# Patient Record
Sex: Female | Born: 1945 | Race: White | Hispanic: No | Marital: Married | Smoking: Never smoker
Health system: Southern US, Community
[De-identification: ages and names within clinical notes are randomized; demographics above are authoritative.]

## PROBLEM LIST (undated history)

## (undated) DIAGNOSIS — E785 Hyperlipidemia, unspecified: Secondary | ICD-10-CM

## (undated) DIAGNOSIS — C50919 Malignant neoplasm of unspecified site of unspecified female breast: Secondary | ICD-10-CM

## (undated) DIAGNOSIS — H44421 Hypotony of right eye due to ocular fistula: Secondary | ICD-10-CM

## (undated) DIAGNOSIS — R52 Pain, unspecified: Secondary | ICD-10-CM

## (undated) DIAGNOSIS — M2550 Pain in unspecified joint: Secondary | ICD-10-CM

## (undated) DIAGNOSIS — T451X5A Adverse effect of antineoplastic and immunosuppressive drugs, initial encounter: Secondary | ICD-10-CM

## (undated) HISTORY — PX: TUBAL LIGATION: SHX77

## (undated) HISTORY — DX: Adverse effect of antineoplastic and immunosuppressive drugs, initial encounter: T45.1X5A

## (undated) HISTORY — DX: Hyperlipidemia, unspecified: E78.5

## (undated) HISTORY — PX: COLONOSCOPY: SHX174

## (undated) HISTORY — PX: REDUCTION MAMMAPLASTY: SUR839

## (undated) HISTORY — PX: BREAST SURGERY: SHX581

## (undated) HISTORY — DX: Malignant neoplasm of unspecified site of unspecified female breast: C50.919

## (undated) HISTORY — DX: Pain, unspecified: R52

## (undated) HISTORY — DX: Pain in unspecified joint: M25.50

## (undated) HISTORY — PX: GANGLION CYST EXCISION: SHX1691

---

## 1997-12-14 ENCOUNTER — Other Ambulatory Visit: Admission: RE | Admit: 1997-12-14 | Discharge: 1997-12-14 | Payer: Self-pay | Admitting: *Deleted

## 1998-12-06 ENCOUNTER — Other Ambulatory Visit: Admission: RE | Admit: 1998-12-06 | Discharge: 1998-12-06 | Payer: Self-pay | Admitting: *Deleted

## 1999-05-03 ENCOUNTER — Encounter: Payer: Self-pay | Admitting: Orthopedic Surgery

## 1999-05-03 ENCOUNTER — Ambulatory Visit (HOSPITAL_COMMUNITY): Admission: RE | Admit: 1999-05-03 | Discharge: 1999-05-03 | Payer: Self-pay | Admitting: Orthopedic Surgery

## 2000-03-26 ENCOUNTER — Other Ambulatory Visit: Admission: RE | Admit: 2000-03-26 | Discharge: 2000-03-26 | Payer: Self-pay | Admitting: *Deleted

## 2001-06-29 ENCOUNTER — Other Ambulatory Visit: Admission: RE | Admit: 2001-06-29 | Discharge: 2001-06-29 | Payer: Self-pay | Admitting: Obstetrics and Gynecology

## 2002-06-30 ENCOUNTER — Other Ambulatory Visit: Admission: RE | Admit: 2002-06-30 | Discharge: 2002-06-30 | Payer: Self-pay | Admitting: *Deleted

## 2003-09-01 ENCOUNTER — Other Ambulatory Visit: Admission: RE | Admit: 2003-09-01 | Discharge: 2003-09-01 | Payer: Self-pay | Admitting: *Deleted

## 2004-03-16 ENCOUNTER — Ambulatory Visit (HOSPITAL_COMMUNITY): Admission: RE | Admit: 2004-03-16 | Discharge: 2004-03-16 | Payer: Self-pay | Admitting: Gastroenterology

## 2005-02-21 ENCOUNTER — Other Ambulatory Visit: Admission: RE | Admit: 2005-02-21 | Discharge: 2005-02-21 | Payer: Self-pay | Admitting: *Deleted

## 2010-06-17 HISTORY — PX: MASTECTOMY: SHX3

## 2010-09-06 ENCOUNTER — Other Ambulatory Visit: Payer: Self-pay | Admitting: Radiology

## 2010-09-07 ENCOUNTER — Other Ambulatory Visit: Payer: Self-pay | Admitting: Radiology

## 2010-09-07 DIAGNOSIS — C50912 Malignant neoplasm of unspecified site of left female breast: Secondary | ICD-10-CM

## 2010-09-11 ENCOUNTER — Ambulatory Visit
Admission: RE | Admit: 2010-09-11 | Discharge: 2010-09-11 | Disposition: A | Discharge status: Home / Self Care | Payer: Medicare Other | Source: Ambulatory Visit | Attending: Radiology | Admitting: Radiology

## 2010-09-11 DIAGNOSIS — C50912 Malignant neoplasm of unspecified site of left female breast: Secondary | ICD-10-CM

## 2010-09-11 MED ORDER — GADOBENATE DIMEGLUMINE 529 MG/ML IV SOLN
15.0000 mL | Freq: Once | INTRAVENOUS | Status: AC | PRN
  Administered 2010-09-11: 15 mL via INTRAVENOUS

## 2010-09-12 ENCOUNTER — Encounter (HOSPITAL_BASED_OUTPATIENT_CLINIC_OR_DEPARTMENT_OTHER): Payer: Medicare Other | Admitting: Oncology

## 2010-09-12 ENCOUNTER — Other Ambulatory Visit: Payer: Self-pay | Admitting: Oncology

## 2010-09-12 DIAGNOSIS — C50919 Malignant neoplasm of unspecified site of unspecified female breast: Secondary | ICD-10-CM

## 2010-09-12 LAB — COMPREHENSIVE METABOLIC PANEL
ALT: 20 U/L (ref 0–35)
AST: 23 U/L (ref 0–37)
Albumin: 4.2 g/dL (ref 3.5–5.2)
Alkaline Phosphatase: 46 U/L (ref 39–117)
BUN: 13 mg/dL (ref 6–23)
CO2: 27 mEq/L (ref 19–32)
Calcium: 10.6 mg/dL — ABNORMAL HIGH (ref 8.4–10.5)
Chloride: 107 mEq/L (ref 96–112)
Creatinine, Ser: 0.63 mg/dL (ref 0.40–1.20)
Glucose, Bld: 80 mg/dL (ref 70–99)
Potassium: 4.4 mEq/L (ref 3.5–5.3)
Sodium: 140 mEq/L (ref 135–145)
Total Bilirubin: 1 mg/dL (ref 0.3–1.2)
Total Protein: 7.3 g/dL (ref 6.0–8.3)

## 2010-09-12 LAB — CBC WITH DIFFERENTIAL/PLATELET
BASO%: 0.4 % (ref 0.0–2.0)
Basophils Absolute: 0 10*3/uL (ref 0.0–0.1)
EOS%: 2.4 % (ref 0.0–7.0)
Eosinophils Absolute: 0.2 10*3/uL (ref 0.0–0.5)
HCT: 41.7 % (ref 34.8–46.6)
HGB: 14.1 g/dL (ref 11.6–15.9)
LYMPH%: 26.1 % (ref 14.0–49.7)
MCH: 30.5 pg (ref 25.1–34.0)
MCHC: 33.8 g/dL (ref 31.5–36.0)
MCV: 90.3 fL (ref 79.5–101.0)
MONO#: 0.8 10*3/uL (ref 0.1–0.9)
MONO%: 11.6 % (ref 0.0–14.0)
NEUT#: 4.3 10*3/uL (ref 1.5–6.5)
NEUT%: 59.5 % (ref 38.4–76.8)
Platelets: 227 10*3/uL (ref 145–400)
RBC: 4.62 10*6/uL (ref 3.70–5.45)
RDW: 13.8 % (ref 11.2–14.5)
WBC: 7.2 10*3/uL (ref 3.9–10.3)
lymph#: 1.9 10*3/uL (ref 0.9–3.3)

## 2010-09-12 LAB — CANCER ANTIGEN 27.29: CA 27.29: 14 U/mL (ref 0–39)

## 2010-09-14 ENCOUNTER — Other Ambulatory Visit: Payer: Self-pay | Admitting: Radiology

## 2010-09-14 DIAGNOSIS — R928 Other abnormal and inconclusive findings on diagnostic imaging of breast: Secondary | ICD-10-CM

## 2010-09-20 ENCOUNTER — Other Ambulatory Visit: Payer: Self-pay | Admitting: Radiology

## 2010-09-20 ENCOUNTER — Other Ambulatory Visit: Payer: Self-pay | Admitting: Diagnostic Radiology

## 2010-09-20 ENCOUNTER — Ambulatory Visit
Admission: RE | Admit: 2010-09-20 | Discharge: 2010-09-20 | Disposition: A | Discharge status: Home / Self Care | Payer: Medicare Other | Source: Ambulatory Visit | Attending: Radiology | Admitting: Radiology

## 2010-09-20 DIAGNOSIS — R928 Other abnormal and inconclusive findings on diagnostic imaging of breast: Secondary | ICD-10-CM

## 2010-09-20 MED ORDER — GADOBENATE DIMEGLUMINE 529 MG/ML IV SOLN
15.0000 mL | Freq: Once | INTRAVENOUS | Status: AC | PRN
  Administered 2010-09-20: 15 mL via INTRAVENOUS

## 2010-10-01 ENCOUNTER — Encounter (HOSPITAL_BASED_OUTPATIENT_CLINIC_OR_DEPARTMENT_OTHER): Payer: Medicare Other | Admitting: Oncology

## 2010-10-01 DIAGNOSIS — C50919 Malignant neoplasm of unspecified site of unspecified female breast: Secondary | ICD-10-CM

## 2010-10-31 ENCOUNTER — Other Ambulatory Visit (HOSPITAL_COMMUNITY): Payer: Self-pay | Admitting: Surgery

## 2010-10-31 ENCOUNTER — Other Ambulatory Visit: Payer: Self-pay | Admitting: Oncology

## 2010-10-31 ENCOUNTER — Encounter (HOSPITAL_BASED_OUTPATIENT_CLINIC_OR_DEPARTMENT_OTHER): Payer: Medicare Other | Admitting: Oncology

## 2010-10-31 DIAGNOSIS — C50919 Malignant neoplasm of unspecified site of unspecified female breast: Secondary | ICD-10-CM

## 2010-10-31 DIAGNOSIS — C50912 Malignant neoplasm of unspecified site of left female breast: Secondary | ICD-10-CM

## 2010-10-31 LAB — CBC WITH DIFFERENTIAL/PLATELET
BASO%: 0.7 % (ref 0.0–2.0)
EOS%: 1.7 % (ref 0.0–7.0)
LYMPH%: 37.6 % (ref 14.0–49.7)
MCH: 30.8 pg (ref 25.1–34.0)
MCHC: 33.5 g/dL (ref 31.5–36.0)
MONO#: 0.6 10*3/uL (ref 0.1–0.9)
Platelets: 224 10*3/uL (ref 145–400)
RBC: 4.43 10*6/uL (ref 3.70–5.45)
WBC: 5.4 10*3/uL (ref 3.9–10.3)

## 2010-10-31 LAB — COMPREHENSIVE METABOLIC PANEL
ALT: 19 U/L (ref 0–35)
AST: 21 U/L (ref 0–37)
Alkaline Phosphatase: 53 U/L (ref 39–117)
CO2: 24 mEq/L (ref 19–32)
Sodium: 143 mEq/L (ref 135–145)
Total Bilirubin: 1.3 mg/dL — ABNORMAL HIGH (ref 0.3–1.2)
Total Protein: 6.8 g/dL (ref 6.0–8.3)

## 2010-12-07 ENCOUNTER — Encounter (INDEPENDENT_AMBULATORY_CARE_PROVIDER_SITE_OTHER): Payer: Self-pay | Admitting: Surgery

## 2010-12-20 ENCOUNTER — Encounter (INDEPENDENT_AMBULATORY_CARE_PROVIDER_SITE_OTHER): Payer: Self-pay | Admitting: Surgery

## 2010-12-20 ENCOUNTER — Ambulatory Visit (INDEPENDENT_AMBULATORY_CARE_PROVIDER_SITE_OTHER): Payer: Medicare Other | Admitting: Surgery

## 2010-12-20 VITALS — Temp 96.7°F | Ht 66.0 in | Wt 192.2 lb

## 2010-12-20 DIAGNOSIS — C50919 Malignant neoplasm of unspecified site of unspecified female breast: Secondary | ICD-10-CM

## 2010-12-20 NOTE — Progress Notes (Signed)
CC: Breast cancer HPI: This patient was seen a few weeks ago and a multidisciplinary clinic. She has a multifocal left breast cancer. She is scheduled for a mastectomy, sentinel lymph node evaluation, and reconstruction next week. She came in for preoperative review.  ROS: There been no changes since the original ROS that is noted in the chart.  MEDS:  ALLERGIES:   PE General: The patient is alert, oriented, and healthy-appearing Lungs: Normal respirations clear to auscultation. Heart: Regular rhythm no murmurs rubs or gallops. Breasts: Symmetric, with no palpable mass or tenderness. Lymphatics: No axillary or supraclavicular adenopathy noted. Abdomen: Soft, benign  Data Reviewed I have reviewed all of our old notes pathology reports et Karie Soda. Of note is that she was started on Femara while we were waiting to get her surgery done.  Assessment Multifocal stage I left breast cancer  Plan Left mastectomy with sentinel node evaluation. Plan reconstruction.  I discussed with the patient including risks and complications need for drains potential for positive lymph nodes et Karie Soda she does have most of her postoperative care will be done by her plastic surgeon. I think all questions have been answered. We will proceed with surgery as scheduled on July 9

## 2010-12-20 NOTE — Patient Instructions (Signed)
We have you scheduled for surgery. Come to the hospital as previously arranged

## 2010-12-21 ENCOUNTER — Encounter (HOSPITAL_COMMUNITY)
Admission: RE | Admit: 2010-12-21 | Discharge: 2010-12-21 | Disposition: A | Discharge status: Home / Self Care | Payer: Medicare Other | Source: Ambulatory Visit | Attending: Surgery | Admitting: Surgery

## 2010-12-21 ENCOUNTER — Other Ambulatory Visit (INDEPENDENT_AMBULATORY_CARE_PROVIDER_SITE_OTHER): Payer: Self-pay | Admitting: Surgery

## 2010-12-21 ENCOUNTER — Ambulatory Visit (HOSPITAL_COMMUNITY)
Admission: RE | Admit: 2010-12-21 | Discharge: 2010-12-21 | Disposition: A | Discharge status: Home / Self Care | Payer: Medicare Other | Source: Ambulatory Visit | Attending: Surgery | Admitting: Surgery

## 2010-12-21 DIAGNOSIS — Z01810 Encounter for preprocedural cardiovascular examination: Secondary | ICD-10-CM | POA: Insufficient documentation

## 2010-12-21 DIAGNOSIS — Z01818 Encounter for other preprocedural examination: Secondary | ICD-10-CM | POA: Insufficient documentation

## 2010-12-21 DIAGNOSIS — Z01812 Encounter for preprocedural laboratory examination: Secondary | ICD-10-CM | POA: Insufficient documentation

## 2010-12-21 DIAGNOSIS — C50912 Malignant neoplasm of unspecified site of left female breast: Secondary | ICD-10-CM

## 2010-12-21 DIAGNOSIS — C50919 Malignant neoplasm of unspecified site of unspecified female breast: Secondary | ICD-10-CM | POA: Insufficient documentation

## 2010-12-21 LAB — SURGICAL PCR SCREEN
MRSA, PCR: POSITIVE — AB
Staphylococcus aureus: POSITIVE — AB

## 2010-12-21 LAB — CBC
MCH: 31.1 pg (ref 26.0–34.0)
MCHC: 34.4 g/dL (ref 30.0–36.0)
MCV: 90.3 fL (ref 78.0–100.0)
Platelets: 205 10*3/uL (ref 150–400)

## 2010-12-21 LAB — BASIC METABOLIC PANEL
Calcium: 11.4 mg/dL — ABNORMAL HIGH (ref 8.4–10.5)
Creatinine, Ser: 0.52 mg/dL (ref 0.50–1.10)
GFR calc non Af Amer: >60 mL/min (ref >60)
Sodium: 142 mEq/L (ref 135–145)

## 2010-12-21 LAB — DIFFERENTIAL
Basophils Relative: 1 % (ref 0–1)
Eosinophils Absolute: 0.1 10*3/uL (ref 0.0–0.7)
Eosinophils Relative: 2 % (ref 0–5)
Lymphs Abs: 2.1 10*3/uL (ref 0.7–4.0)
Monocytes Absolute: 0.8 10*3/uL (ref 0.1–1.0)
Monocytes Relative: 14 % — ABNORMAL HIGH (ref 3–12)

## 2010-12-24 ENCOUNTER — Other Ambulatory Visit (INDEPENDENT_AMBULATORY_CARE_PROVIDER_SITE_OTHER): Payer: Self-pay | Admitting: Surgery

## 2010-12-24 ENCOUNTER — Inpatient Hospital Stay (HOSPITAL_COMMUNITY)
Admission: RE | Admit: 2010-12-24 | Discharge: 2010-12-24 | Disposition: A | Discharge status: Home / Self Care | Payer: Medicare Other | Source: Ambulatory Visit | Attending: Surgery | Admitting: Surgery

## 2010-12-24 ENCOUNTER — Observation Stay (HOSPITAL_COMMUNITY)
Admission: RE | Admit: 2010-12-24 | Discharge: 2010-12-25 | Disposition: A | Discharge status: Home / Self Care | Payer: Medicare Other | Source: Ambulatory Visit | Attending: Surgery | Admitting: Surgery

## 2010-12-24 DIAGNOSIS — Z01812 Encounter for preprocedural laboratory examination: Secondary | ICD-10-CM | POA: Insufficient documentation

## 2010-12-24 DIAGNOSIS — D0590 Unspecified type of carcinoma in situ of unspecified breast: Secondary | ICD-10-CM

## 2010-12-24 DIAGNOSIS — C773 Secondary and unspecified malignant neoplasm of axilla and upper limb lymph nodes: Secondary | ICD-10-CM | POA: Insufficient documentation

## 2010-12-24 DIAGNOSIS — Z13820 Encounter for screening for osteoporosis: Secondary | ICD-10-CM | POA: Insufficient documentation

## 2010-12-24 DIAGNOSIS — C50919 Malignant neoplasm of unspecified site of unspecified female breast: Principal | ICD-10-CM | POA: Insufficient documentation

## 2010-12-24 DIAGNOSIS — C50912 Malignant neoplasm of unspecified site of left female breast: Secondary | ICD-10-CM

## 2010-12-24 DIAGNOSIS — Z01818 Encounter for other preprocedural examination: Secondary | ICD-10-CM | POA: Insufficient documentation

## 2010-12-24 DIAGNOSIS — Z79899 Other long term (current) drug therapy: Secondary | ICD-10-CM | POA: Insufficient documentation

## 2010-12-24 DIAGNOSIS — E119 Type 2 diabetes mellitus without complications: Secondary | ICD-10-CM | POA: Insufficient documentation

## 2010-12-24 LAB — GLUCOSE, CAPILLARY: Glucose-Capillary: 151 mg/dL — ABNORMAL HIGH (ref 70–99)

## 2010-12-24 MED ORDER — TECHNETIUM TC 99M SULFUR COLLOID FILTERED
1.0000 | Freq: Once | INTRAVENOUS | Status: AC | PRN
  Administered 2010-12-24: 1 via INTRADERMAL

## 2010-12-25 LAB — GLUCOSE, CAPILLARY
Glucose-Capillary: 119 mg/dL — ABNORMAL HIGH (ref 70–99)
Glucose-Capillary: 104 mg/dL — ABNORMAL HIGH (ref 70–99)

## 2010-12-25 NOTE — Op Note (Signed)
Jennifer Rivera, IRVING NO.:  [id number]  MEDICAL RECORD NO.:  [id number]  LOCATION:  5123                         FACILITY:  [facility]  PHYSICIAN:  Currie Paris, M.D.DATE OF BIRTH:  01-Nov-1945  DATE OF PROCEDURE:  12/24/2010 DATE OF DISCHARGE:                              OPERATIVE REPORT   PREOPERATIVE DIAGNOSIS:  Carcinoma, left breast, multifocal, invasive.  POSTOPERATIVE DIAGNOSIS:  Carcinoma, left breast, multifocal, invasive with axillary metastases (stage II).  PROCEDURE:  Left modified radical mastectomy with blue dye injection and sentinel lymph node identification.  SURGEON:  Currie Paris, MD  ASSISTANT:  Abigail Miyamoto, MD  ANESTHESIA:  General.  CLINICAL HISTORY:  This is a 65 year old lady recently found to have a left breast mass and a second suspicious area on mammogram.  It was receptor positive.  After we found the second area, it was elected to do a mastectomy with sentinel node evaluation.  Clinically, her nodes were negative.  Because of some travel arrangements, the patient wished to defer her surgery for approximately a month, so she was started on neoadjuvant Femara.  DESCRIPTION OF PROCEDURE:  I saw the patient in the holding area and she had no further questions.  We reviewed the plans for the surgery with the plans for a implant (tissue expander) for reconstruction by Dr. Odis Luster.  We reviewed the plans for sentinel node evaluation and the plans for an attempted skin sparing mastectomy.  The patient was taken to the operating room and after satisfactory general anesthesia had been obtained, the time-out was done.  I injected dilute methylene blue separately on the left.  I outlined an oblique incision elliptically just wide enough to encompass the nipple-areolar complex.  I used the Neoprobe and identified a hot area and marked the overlying skin in the axilla.  I raised the superior skin flap to the sternum up  to the clavicle and out into the axilla.  Using the Neoprobe, I directed my attention to open the clavipectoral fascia and identified almost immediately a blue lymphatic leading to a blue lymph node.  There was a second non-hot, non- blue lymph node adjacent and I took both of the hot one and the other one out as this seemed to be intimately attached.  This was sent as sentinel lymph node #1.  Another hot area was identified and a very small, normal-appearing somewhat higher and more medially located node was removed and sent.  I asked for touch preps on these.  I then went back and did the inferior flap in the usual fashion and took it out laterally to the latissimus.  The breast was removed from medial to lateral taking the fascia.  As I got out to the axilla, I divided the breast tissue off the chest wall without entering the axilla.  I stopped to make sure everything was dry.  At this point, Dr. Colonel Bald reported that one blue sentinel lymph node was positive for metastatic disease.  Therefore, as planned, I went ahead with additional node dissection.  I fully opened the clavipectoral fascia, identified the axillary vein and started stripping the contents out from medial to lateral, superior to inferior  preserving the long thoracic and thoracodorsal nerves, and the nerve to the pectoralis.  This was all done with a combination of blunt and sharp dissection.  I used clips on vessels.  Once this was out, I reirrigated and made sure everything was dry.  I pinched the long thoracic and thoracodorsal nerves and they both appeared to be working okay.  Warm packs were placed and we waited Dr. Odis Luster to review for implant reconstruction.     Currie Paris, M.D.     CJS/MEDQ  D:  12/24/2010  T:  12/24/2010  Job:  [id number]  cc:   [name], M.D.  Electronically Signed by Cyndia Bent M.D. on 12/25/2010 07:55:18 AM

## 2011-01-08 ENCOUNTER — Other Ambulatory Visit: Payer: Self-pay | Admitting: Oncology

## 2011-01-08 ENCOUNTER — Encounter (HOSPITAL_BASED_OUTPATIENT_CLINIC_OR_DEPARTMENT_OTHER): Payer: Medicare Other | Admitting: Oncology

## 2011-01-08 DIAGNOSIS — C50919 Malignant neoplasm of unspecified site of unspecified female breast: Secondary | ICD-10-CM

## 2011-01-08 DIAGNOSIS — C50219 Malignant neoplasm of upper-inner quadrant of unspecified female breast: Secondary | ICD-10-CM

## 2011-01-08 DIAGNOSIS — C50519 Malignant neoplasm of lower-outer quadrant of unspecified female breast: Secondary | ICD-10-CM

## 2011-01-08 LAB — COMPREHENSIVE METABOLIC PANEL
ALT: 18 U/L (ref 0–35)
Alkaline Phosphatase: 47 U/L (ref 39–117)
Potassium: 5.1 mEq/L (ref 3.5–5.3)
Sodium: 142 mEq/L (ref 135–145)
Total Bilirubin: 0.7 mg/dL (ref 0.3–1.2)
Total Protein: 6.3 g/dL (ref 6.0–8.3)

## 2011-01-08 LAB — CBC WITH DIFFERENTIAL/PLATELET
Basophils Absolute: 0 10*3/uL (ref 0.0–0.1)
Eosinophils Absolute: 0.2 10*3/uL (ref 0.0–0.5)
HCT: 40.4 % (ref 34.8–46.6)
HGB: 13.1 g/dL (ref 11.6–15.9)
LYMPH%: 29.2 % (ref 14.0–49.7)
MCV: 90.6 fL (ref 79.5–101.0)
MONO%: 8.9 % (ref 0.0–14.0)
RBC: 4.46 10*6/uL (ref 3.70–5.45)
WBC: 6.3 10*3/uL (ref 3.9–10.3)

## 2011-01-18 ENCOUNTER — Ambulatory Visit (INDEPENDENT_AMBULATORY_CARE_PROVIDER_SITE_OTHER): Payer: Medicare Other | Admitting: Surgery

## 2011-01-18 ENCOUNTER — Encounter (INDEPENDENT_AMBULATORY_CARE_PROVIDER_SITE_OTHER): Payer: Medicare Other | Admitting: Surgery

## 2011-01-18 ENCOUNTER — Encounter (INDEPENDENT_AMBULATORY_CARE_PROVIDER_SITE_OTHER): Payer: Self-pay | Admitting: Surgery

## 2011-01-18 VITALS — BP 132/98 | HR 76 | Temp 95.6°F

## 2011-01-18 DIAGNOSIS — C50919 Malignant neoplasm of unspecified site of unspecified female breast: Secondary | ICD-10-CM

## 2011-01-18 MED ORDER — CLINDAMYCIN HCL 300 MG PO CAPS: 300.0000 mg | ORAL_CAPSULE | ORAL | Status: AC

## 2011-01-18 MED ORDER — HYDROMORPHONE HCL 2 MG PO TABS: 2.0000 mg | ORAL_TABLET | ORAL | Status: DC | PRN

## 2011-01-18 NOTE — Patient Instructions (Addendum)
Call to see about an ABC class. Check with Dr. Odis Luster Monday to get the drain out. Continue taking antibiotic until Dr Odis Luster says its ok to stop

## 2011-01-18 NOTE — Progress Notes (Signed)
CC: Postop visit with pain from drain  HPI: This patient comes in for post op follow-up. She underwent left mastectomy and lymph node dissection on December 24, 2010. She feels that she is doing well except for significant pain around the drain site which is draining more than about 40 cc per day.  PE: General: The patient appears to be healthy, NAD  The left breast status post reconstruction and looks healthy. She continues to have drainage. This is about 40 cc. The drain itself was under a lot of tension and I think her pain was from the suture pulling on the skin. I redressed out with less tension and the pain resolved.  IMPRESSION: The patient is doing well S/P mastectomy and reconstruction. The only issue is to continue drainage. She is following up with Dr. Odis Luster next week.  DATA REVIWED: Dr. was noted that she has already discussed this with her oncologist  PLAN: Gave her some exercises to do when she gets the okay from Dr. Odis Luster. Gave her an ABC appointment okay. I will see her back in two weeks.

## 2011-01-22 ENCOUNTER — Encounter (INDEPENDENT_AMBULATORY_CARE_PROVIDER_SITE_OTHER): Payer: Medicare Other | Admitting: Surgery

## 2011-01-30 NOTE — Op Note (Signed)
  NAMEMarland Kitchen  NIAYA, HICKOK NO.:  [id number]  MEDICAL RECORD NO.:  [id number]  LOCATION:  5123                         FACILITY:  [facility]  PHYSICIAN:  Etter Sjogren, M.D.     DATE OF BIRTH:  September 06, 1945  DATE OF PROCEDURE:  12/24/2010 DATE OF DISCHARGE:                              OPERATIVE REPORT   PREOPERATIVE DIAGNOSIS:  Left breast cancer.  POSTOPERATIVE DIAGNOSIS:  Left breast cancer.  PROCEDURE PERFORMED:  Left breast reconstruction with tissue expander.  SURGEON:  Etter Sjogren, MD.  ANESTHESIA:  General.  ESTIMATED BLOOD LOSS:  5 mL.  DRAINS:  Two 19-French drains.  CLINICAL NOTE:  This is a 65 year old woman who has left breast cancer is having mastectomy and desires reconstruction.  Options discussed. She selected tissue expander as a planned stage procedure with eventual placement of implant.  She understood that asymmetry type of procedure might be needed on the opposite breast as well.  Risks plus complications discussed and she understood those and wished to proceed.  DESCRIPTION:  The patient was taken to the operating room.  Dr. Jamey Ripa had completed the mastectomy.  The wound was irrigated thoroughly with saline.  Good hemostasis was confirmed.  The submuscular space was developed beneath the pectoralis major muscle and the serratus anterior muscle taking great care to avoid damage to the underlying chest cavity and to achieve excellent hemostasis using electrocautery.  Thorough irrigation with antibiotic solution.  Thorough cleansing of gloves.  The expander was prepared.  This was a Mentor 650 mL tissue expander, 100 mL sterile saline placed using a closed filling system and expanders soaked in antibiotic solution for greater than 5 minutes.  Expander was positioned after again checking and confirming excellent hemostasis and the muscle was closed with 3-0 Vicryl interrupted figure-of-eight sutures.  Great care taken to avoid damage to  underlying expander which was kept under direct vision at all times.  Antibiotic solution also placed on the expander again just prior to placing the last sutures. Space was then checked from the mastectomy.  Meticulous hemostasis with electrocautery.  Two 19-French drains were positioned, brought through separate stab wounds inferolaterally and secured with 3-0 Prolene sutures.  Skin flaps had excellent color, bright red bleeding on the periphery consistent with viability and the skin flap closure with 3-0 Monocryl interrupted deep dermal sutures.  Antibiotic solution again placed just prior to tying the end of the last suture and then Dermabond used to complete the closure, dry sterile dressings and the Biopatch placed around the drains with Tegaderm and dry sterile dressings placed as well as the chest vest and she was transferred to the recovery room in stable having tolerated the procedure well.     Etter Sjogren, M.D.     DB/MEDQ  D:  12/24/2010  T:  12/24/2010  Job:  [id number]  Electronically Signed by Etter Sjogren M.D. on 01/30/2011 02:56:53 PM

## 2011-04-08 ENCOUNTER — Encounter (HOSPITAL_BASED_OUTPATIENT_CLINIC_OR_DEPARTMENT_OTHER)
Admission: RE | Admit: 2011-04-08 | Discharge: 2011-04-08 | Disposition: A | Discharge status: Home / Self Care | Payer: Medicare Other | Source: Ambulatory Visit | Attending: Plastic Surgery | Admitting: Plastic Surgery

## 2011-04-08 LAB — BASIC METABOLIC PANEL
BUN: 12 mg/dL (ref 6–23)
Calcium: 10.8 mg/dL — ABNORMAL HIGH (ref 8.4–10.5)
Creatinine, Ser: 0.56 mg/dL (ref 0.50–1.10)
GFR calc non Af Amer: >90 mL/min (ref >90)
Glucose, Bld: 108 mg/dL — ABNORMAL HIGH (ref 70–99)

## 2011-04-10 ENCOUNTER — Ambulatory Visit (HOSPITAL_BASED_OUTPATIENT_CLINIC_OR_DEPARTMENT_OTHER)
Admission: RE | Admit: 2011-04-10 | Discharge: 2011-04-10 | Disposition: A | Discharge status: Home / Self Care | Payer: Medicare Other | Source: Ambulatory Visit | Attending: Plastic Surgery | Admitting: Plastic Surgery

## 2011-04-10 ENCOUNTER — Other Ambulatory Visit: Payer: Self-pay | Admitting: Plastic Surgery

## 2011-04-10 DIAGNOSIS — Z421 Encounter for breast reconstruction following mastectomy: Secondary | ICD-10-CM | POA: Insufficient documentation

## 2011-04-10 DIAGNOSIS — Z01812 Encounter for preprocedural laboratory examination: Secondary | ICD-10-CM | POA: Insufficient documentation

## 2011-04-10 DIAGNOSIS — C50919 Malignant neoplasm of unspecified site of unspecified female breast: Secondary | ICD-10-CM | POA: Insufficient documentation

## 2011-04-10 DIAGNOSIS — E119 Type 2 diabetes mellitus without complications: Secondary | ICD-10-CM | POA: Insufficient documentation

## 2011-04-10 LAB — GLUCOSE, CAPILLARY: Glucose-Capillary: 114 mg/dL — ABNORMAL HIGH (ref 70–99)

## 2011-04-15 NOTE — Op Note (Signed)
NAMEMarland Rivera  KALECIA, HARTNEY NO.:  [id number]  MEDICAL RECORD NO.:  [id number]  LOCATION:                                 FACILITY:  PHYSICIAN:  Etter Sjogren, M.D.     DATE OF BIRTH:  May 19, 1946  DATE OF PROCEDURE:  04/10/2011 DATE OF DISCHARGE:                              OPERATIVE REPORT   PREOPERATIVE DIAGNOSIS:  Left breast cancer.  POSTOPERATIVE DIAGNOSIS:  Left breast cancer.  PROCEDURE PERFORMED: 1. Removal of tissue expander and placement of saline implant left     side. 2. Right mastopexy.  SURGEON:  Etter Sjogren, MD  ANESTHESIA:  General.  ESTIMATED BLOOD LOSS:  100 mL.  DRAINS:  One 19-French on the left side.  CLINICAL NOTE:  This 65 year old woman who has had breast cancer left side, has had mastectomy, reconstruction, tissue expanders, and presents for removal of the expander and placement of an implant and a mastopexy on the right side for asymmetry.  She selected 900 mL implant.  She prefers saline and silicone gel and she understood that this implant will be a little bit smaller than the specimen that we removed, but she like the size 900 mL for the left side.  The nature of these procedures and risks were discussed with her in great detail.  Risk include but not limited to bleeding, infection, anesthesia complications, healing problems, scarring, loss of sensation, fluid accumulations, loss of tissue, loss of the nipple on the right, loss of skin, muscle fatty tissue, failure of the device to be used on the left since she has a device on the left, wrinkles or ripples on the left, and capsular contracture on the left side, pneumothorax, pulmonary embolism, and she understood all of this and wished to proceed.  DESCRIPTION OF PROCEDURE:  The patient was marked in the holding area in a full standing position.  She was marked for mastopexy as well as for the placement of the implant on the left.  She was then taken to the operating room,  placed supine.  After successful induction of general anesthesia, she was prepped with ChloraPrep and after waiting full 3 minutes for drying, she was draped with sterile drapes.  The left side was approached first.  The old mastectomy scar opened.  The inferior mastectomy flap was elevated and a muscle-splitting incision was used to access the tissue expander, which was deflated and removed.  The pocket was inspected and was noted to be in good condition.  The capsulotomy was performed inferiorly using electrocautery and hemostasis with electrocautery.  This released things very nicely down to the marking that had been placed preoperatively.  Irrigation with saline antibiotic solution was also placed and allowed to dwell on the space.  3-0 Vicryl sutures were pre-placed for the muscle closure and left untied.  The implant was prepared, it was soaked in antibiotic solution, and after thoroughly cleaning gloves, the air was removed, and 100 mL of sterile saline placed using a closed filling system.  The implant was returned to antibiotic solution.  Inspection of the space was then performed and the space was noted to be in excellent condition.  A 19-French drain  was positioned, brought out through separate stab wound inferolaterally and secured with 3-0 Prolene suture.  Again after cleaning gloves, the antibiotic solution was placed in the space and then the implant was positioned filled to the maximum 900 mL using a closed filling system with sterile saline.  Antibiotic solution again used to irrigate and 3-0 Vicryl sutures tied securely.  The skin flap inferior mastectomy flap was redraped, there was some redundant skin, and this was excised and sent as a specimen.  Again hemostasis with electrocautery, irrigation with saline, and the closure of the wound with 3-0 Monocryl interrupted inverted deep dermal sutures and a running 3-0 Monocryl subcuticular suture.  Attention was then  directed to the right side where the 42-mm marker marked the nipple-areolar complex, and then all incisions made and the excess skin was removed within the confines of these incisions.  The superior flaps were then developed in order to redrape the skin and thorough irrigation with saline and meticulous hemostasis with electrocautery.  Excellent hemostasis had been achieved.  The nipple was inspected and found to have excellent color and bright red bleeding around its periphery consistent with viability.  The closure with a 0- Vicryl interrupted inverted deep suture at the inverted T position followed by 3-0 Monocryl interrupted inverted deep dermal sutures and a running 3-0 Monocryl subcuticular suture.  A measurement was then taken 5 cm up from the inframammary crease and the 42-mm marker marked the new nipple-areolar site.  Tissue was resected and the nipple-areolar complex brought out through this opening and it was inspected and found to have bright red bleeding around its periphery with excellent color and capillary refill consistent with viability.  The wound was irrigated with saline again and excellent hemostasis with electrocautery, and then the inside of the nipple-areolar complex with 3-0 Monocryl interrupted inverted deep dermal sutures and a running 4-0 Monocryl subcuticular suture.  Steri-Strips, dry sterile dressing, Xeroform gauze, dry sterile dressing, and a circumferential Ace wrap were applied.  She was transferred to the recovery room stable having tolerated the procedure well.     Etter Sjogren, M.D.     DB/MEDQ  D:  04/10/2011  T:  04/10/2011  Job:  [id number]  Electronically Signed by Etter Sjogren M.D. on 04/15/2011 12:56:34 PM

## 2011-04-17 ENCOUNTER — Telehealth: Payer: Self-pay | Admitting: *Deleted

## 2011-04-17 ENCOUNTER — Other Ambulatory Visit: Payer: Self-pay | Admitting: Oncology

## 2011-04-17 ENCOUNTER — Ambulatory Visit (HOSPITAL_BASED_OUTPATIENT_CLINIC_OR_DEPARTMENT_OTHER): Payer: Medicare Other | Admitting: Oncology

## 2011-04-17 DIAGNOSIS — C50519 Malignant neoplasm of lower-outer quadrant of unspecified female breast: Secondary | ICD-10-CM

## 2011-04-17 DIAGNOSIS — C50219 Malignant neoplasm of upper-inner quadrant of unspecified female breast: Secondary | ICD-10-CM

## 2011-04-17 DIAGNOSIS — C50919 Malignant neoplasm of unspecified site of unspecified female breast: Secondary | ICD-10-CM

## 2011-04-17 LAB — COMPREHENSIVE METABOLIC PANEL
ALT: 18 U/L (ref 0–35)
BUN: 16 mg/dL (ref 6–23)
CO2: 26 mEq/L (ref 19–32)
Calcium: 11.1 mg/dL — ABNORMAL HIGH (ref 8.4–10.5)
Chloride: 106 mEq/L (ref 96–112)
Creatinine, Ser: 0.59 mg/dL (ref 0.50–1.10)
Glucose, Bld: 98 mg/dL (ref 70–99)

## 2011-04-17 LAB — CBC WITH DIFFERENTIAL/PLATELET
Basophils Absolute: 0 10*3/uL (ref 0.0–0.1)
Eosinophils Absolute: 0.2 10*3/uL (ref 0.0–0.5)
HCT: 39.1 % (ref 34.8–46.6)
HGB: 13 g/dL (ref 11.6–15.9)
MONO#: 0.5 10*3/uL (ref 0.1–0.9)
NEUT%: 52.1 % (ref 38.4–76.8)
Platelets: 269 10*3/uL (ref 145–400)
WBC: 5.7 10*3/uL (ref 3.9–10.3)
lymph#: 2 10*3/uL (ref 0.9–3.3)

## 2011-04-22 NOTE — Progress Notes (Signed)
CC:   Currie Paris, M.D. Lurline Hare, M.D. 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  The patient is status post modified radical mastectomy of the left breast with sentinel node biopsy for multifocal invasive cancer.  The tumor was estrogen receptor positive, progesterone receptor positive, HER-2/neu negative with favorable Ki-67 of 17%.  The largest focus was 1.9 cm.  The patient did have axillary lymph node dissection and 1 of 15 lymph nodes were positive for metastatic disease.  Postoperatively she has done well and she has had reconstruction performed with right breast reduction.  She is on letrozole 2.5 mg daily since July 2012.  INTERVAL HISTORY:  The patient is seen in followup today.  She is doing well.  She denies any fevers, chills, night sweats, headaches.  She has no shortness of breath.  No chest pains.  No palpitations.  She does have a little bit of achiness, but something that is absolutely bearable for her.  Remainder of the 10 point review of systems is negative and scanned separately into the electronic medical record.  CURRENT MEDICATIONS:  Reviewed and they are updated.  ALLERGIES:  Sulfa, penicillin, methocarbamol and povidone iodine.  PHYSICAL EXAMINATION:  The patient is awake, alert, in no acute distress.  She appears well.  Vital signs:  Temperature is 98.8, pulse 65, respirations 20, blood pressure 144/88 and weight is 188.7 pounds. HEENT:  EOMI.  PERRLA.  Sclerae is anicteric.  No conjunctival pallor. Oral mucosa is moist.  Neck:  Supple.  Lungs:  Clear.  Cardiovascular: Regular rate and rhythm.  No murmurs, gallops or rubs.  Abdomen:  Soft, nontender.  No HSM.  Extremities:  No edema.  Neurologic:  Patient is alert, oriented, otherwise nonfocal.  Breast:  Bilateral surgical scars are healing well.  There is no nodularity in the overlying skin in  the reconstructed breast.  LABORATORY DATA:  WBC 5.7, hemoglobin 13.0, hematocrit 39.1, platelets are 269,000.  IMPRESSION AND PLAN:  A 65 year old female with stage II invasive ductal carcinoma low to intermediate-grade status post left modified radical mastectomy for multifocal disease, largest tumor measured 1.5 cm.  One lymph node was positive on axillary lymph node dissection.  Tumor was ER positive, PR positive, HER-2/neu negative with a Ki-67 of 17%.  The patient underwent a mastectomy, now on letrozole 2.5 mg daily since April 2012.  She is overall doing well and I will plan on seeing her back in 6 months' time for followup.    ______________________________ [name], M.D. KK/MEDQ  D:  04/18/2011  T:  04/22/2011  Job:  [id number]

## 2011-06-18 HISTORY — PX: LEG SURGERY: SHX1003

## 2011-06-20 DIAGNOSIS — M76899 Other specified enthesopathies of unspecified lower limb, excluding foot: Secondary | ICD-10-CM | POA: Diagnosis not present

## 2011-06-20 DIAGNOSIS — M25559 Pain in unspecified hip: Secondary | ICD-10-CM | POA: Diagnosis not present

## 2011-06-25 DIAGNOSIS — M76899 Other specified enthesopathies of unspecified lower limb, excluding foot: Secondary | ICD-10-CM | POA: Diagnosis not present

## 2011-06-25 DIAGNOSIS — M25559 Pain in unspecified hip: Secondary | ICD-10-CM | POA: Diagnosis not present

## 2011-06-27 DIAGNOSIS — M76899 Other specified enthesopathies of unspecified lower limb, excluding foot: Secondary | ICD-10-CM | POA: Diagnosis not present

## 2011-06-27 DIAGNOSIS — M25519 Pain in unspecified shoulder: Secondary | ICD-10-CM | POA: Diagnosis not present

## 2011-07-02 DIAGNOSIS — M76899 Other specified enthesopathies of unspecified lower limb, excluding foot: Secondary | ICD-10-CM | POA: Diagnosis not present

## 2011-07-04 DIAGNOSIS — M76899 Other specified enthesopathies of unspecified lower limb, excluding foot: Secondary | ICD-10-CM | POA: Diagnosis not present

## 2011-07-04 DIAGNOSIS — M25519 Pain in unspecified shoulder: Secondary | ICD-10-CM | POA: Diagnosis not present

## 2011-07-09 DIAGNOSIS — M76899 Other specified enthesopathies of unspecified lower limb, excluding foot: Secondary | ICD-10-CM | POA: Diagnosis not present

## 2011-07-09 DIAGNOSIS — M25519 Pain in unspecified shoulder: Secondary | ICD-10-CM | POA: Diagnosis not present

## 2011-07-11 DIAGNOSIS — M25519 Pain in unspecified shoulder: Secondary | ICD-10-CM | POA: Diagnosis not present

## 2011-07-11 DIAGNOSIS — M76899 Other specified enthesopathies of unspecified lower limb, excluding foot: Secondary | ICD-10-CM | POA: Diagnosis not present

## 2011-07-16 DIAGNOSIS — M25519 Pain in unspecified shoulder: Secondary | ICD-10-CM | POA: Diagnosis not present

## 2011-07-16 DIAGNOSIS — M76899 Other specified enthesopathies of unspecified lower limb, excluding foot: Secondary | ICD-10-CM | POA: Diagnosis not present

## 2011-07-18 DIAGNOSIS — M76899 Other specified enthesopathies of unspecified lower limb, excluding foot: Secondary | ICD-10-CM | POA: Diagnosis not present

## 2011-07-18 DIAGNOSIS — M25519 Pain in unspecified shoulder: Secondary | ICD-10-CM | POA: Diagnosis not present

## 2011-09-25 ENCOUNTER — Other Ambulatory Visit: Payer: Self-pay | Admitting: Internal Medicine

## 2011-09-25 DIAGNOSIS — Z1231 Encounter for screening mammogram for malignant neoplasm of breast: Secondary | ICD-10-CM

## 2011-10-16 ENCOUNTER — Other Ambulatory Visit (HOSPITAL_BASED_OUTPATIENT_CLINIC_OR_DEPARTMENT_OTHER): Payer: Medicare Other | Admitting: Lab

## 2011-10-16 ENCOUNTER — Encounter: Payer: Self-pay | Admitting: Oncology

## 2011-10-16 ENCOUNTER — Telehealth: Payer: Self-pay | Admitting: *Deleted

## 2011-10-16 ENCOUNTER — Ambulatory Visit (HOSPITAL_BASED_OUTPATIENT_CLINIC_OR_DEPARTMENT_OTHER): Payer: Medicare Other | Admitting: Oncology

## 2011-10-16 VITALS — BP 126/84 | HR 67 | Temp 98.3°F | Ht 66.0 in | Wt 189.1 lb

## 2011-10-16 DIAGNOSIS — C50919 Malignant neoplasm of unspecified site of unspecified female breast: Secondary | ICD-10-CM

## 2011-10-16 DIAGNOSIS — T451X5A Adverse effect of antineoplastic and immunosuppressive drugs, initial encounter: Secondary | ICD-10-CM

## 2011-10-16 DIAGNOSIS — C773 Secondary and unspecified malignant neoplasm of axilla and upper limb lymph nodes: Secondary | ICD-10-CM | POA: Diagnosis not present

## 2011-10-16 DIAGNOSIS — C50519 Malignant neoplasm of lower-outer quadrant of unspecified female breast: Secondary | ICD-10-CM

## 2011-10-16 DIAGNOSIS — IMO0001 Reserved for inherently not codable concepts without codable children: Secondary | ICD-10-CM

## 2011-10-16 DIAGNOSIS — M2550 Pain in unspecified joint: Secondary | ICD-10-CM

## 2011-10-16 DIAGNOSIS — C50219 Malignant neoplasm of upper-inner quadrant of unspecified female breast: Secondary | ICD-10-CM | POA: Diagnosis not present

## 2011-10-16 DIAGNOSIS — Z170 Estrogen receptor positive status [ER+]: Secondary | ICD-10-CM | POA: Diagnosis not present

## 2011-10-16 HISTORY — DX: Pain in unspecified joint: M25.50

## 2011-10-16 HISTORY — DX: Adverse effect of antineoplastic and immunosuppressive drugs, initial encounter: T45.1X5A

## 2011-10-16 LAB — CBC WITH DIFFERENTIAL/PLATELET
BASO%: 0.7 % (ref 0.0–2.0)
HCT: 39.9 % (ref 34.8–46.6)
MCHC: 33.1 g/dL (ref 31.5–36.0)
MONO#: 0.4 10*3/uL (ref 0.1–0.9)
NEUT#: 2.8 10*3/uL (ref 1.5–6.5)
NEUT%: 54.1 % (ref 38.4–76.8)
WBC: 5.2 10*3/uL (ref 3.9–10.3)
lymph#: 1.8 10*3/uL (ref 0.9–3.3)
nRBC: 0 % (ref 0–0)

## 2011-10-16 LAB — COMPREHENSIVE METABOLIC PANEL
ALT: 19 U/L (ref 0–35)
AST: 24 U/L (ref 0–37)
Albumin: 4.8 g/dL (ref 3.5–5.2)
BUN: 23 mg/dL (ref 6–23)
Calcium: 11 mg/dL — ABNORMAL HIGH (ref 8.4–10.5)
Chloride: 109 mEq/L (ref 96–112)
Potassium: 4.6 mEq/L (ref 3.5–5.3)

## 2011-10-16 MED ORDER — DULOXETINE HCL 20 MG PO CPEP: 20.0000 mg | ORAL_CAPSULE | Freq: Every day | ORAL | Status: DC

## 2011-10-16 NOTE — Telephone Encounter (Signed)
gave patient appointment for 01-02-2012 starting at 10:30am with labs printed out calendar and gave patient

## 2011-10-16 NOTE — Patient Instructions (Signed)
1. Please stop letrozole for now  2. Begin Cymbalta 20 mg daily.  3. I will see you back in 1 month, however please call sooner if you think you need to be seen sooner by me.

## 2011-10-16 NOTE — Progress Notes (Signed)
OFFICE PROGRESS NOTE  CC  Gwen Pounds, MD, MD [address] [facility], [place]. [place] [address] Dr. Lurline Hare Dr. 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  PRIOR THERAPY:  #1 patient underwent a modified radical mastectomy of the left breast with sentinel node biopsy for a multifocal invasive ductal carcinoma in March 2012. The tumor was ER positive PR positive HER-2/neu negative with a proliferation marker of 17%. The largest focus measured 1.9 cm. Patient did have axillary lymph node dissection with one of 5 lymph nodes positive for metastatic disease.  #2 patient had reconstruction performed with right breast reduction.  #3 she then went on to receive letrozole 2.5 mg starting July 2012.  CURRENT THERAPY:Letrozole 2.5 mg daily  INTERVAL HISTORY: Jennifer Rivera 66 y.o. female returns for Followup visit today. I do think that her main complaint is aches and pains specifically in the hips joints and lower extremities. She is in significant amount of pain that you can literally seated in her face. She still continues to try to do the best you possibly can in terms of her activity but instead in spite of being very active she continues to have pain. She has no headaches no double vision no fevers no chills no night sweats no shortness of breath she has not noticed any masses in her breast in her right breast. Remainder of the 10 point review of systems is negative.  MEDICAL HISTORY: Past Medical History  Diagnosis Date  . Pain   . Hyperlipidemia     controlled  . Diabetes mellitus   . Breast cancer   . 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  MEDICATIONS:  Current Outpatient Prescriptions  Medication Sig Dispense Refill  . CRESTOR 10 MG tablet 10 mg daily.       Marland Kitchen ibuprofen (ADVIL,MOTRIN) 800 MG  tablet Take 800 mg by mouth 2 (two) times daily.      Marland Kitchen letrozole (FEMARA) 2.5 MG tablet Take 2.5 mg by mouth daily.       . metFORMIN (GLUCOPHAGE) 1000 MG tablet Take 1,000 mg by mouth daily.       . DULoxetine (CYMBALTA) 20 MG capsule Take 1 capsule (20 mg total) by mouth daily.  30 capsule  2    SURGICAL HISTORY:  Past Surgical History  Procedure Date  . Breast surgery     mastectomy -lt  . Ganglion cyst excision   . Tubal ligation     REVIEW OF SYSTEMS:  Pertinent items are noted in HPI.   PHYSICAL EXAMINATION:   Patient is awake alert she is in pain which is obvious from her facial expressions. HEENT exam EOMI PERRLA sclerae anicteric no conjunctival pallor oral mucosa is moist neck is supple. Lungs: Clear bilaterally to auscultation and percussion cardiovascular: Regular rate rhythm no murmurs gallops or rubs abdomen: Soft nontender nondistended bowel sounds are present no hepatosplenomegaly extremities: No clubbing edema or cyanosis musculoskeletal systems: Patient has limited range of motion with tenderness on percussion in the hip. Neuro: Patient is alert oriented otherwise nonfocal. Bilateral breast examination: Right breast no masses nipple discharge no skin changes well healed surgical scar from the reduction. Left reconstructed breast no evidence of local skin problems.  ECOG PERFORMANCE STATUS: 1 - Symptomatic but completely ambulatory  Blood pressure 126/84, pulse 67, temperature 98.3 F (36.8 C), height 5\' 6"  (1.676 m), weight  189 lb 1.6 oz (85.775 kg).  LABORATORY DATA: Lab Results  Component Value Date   WBC 5.2 10/16/2011   HGB 13.2 10/16/2011   HCT 39.9 10/16/2011   MCV 91.7 10/16/2011   PLT 219 10/16/2011      Chemistry      Component Value Date/Time   NA 140 04/17/2011 1414   K 4.7 04/17/2011 1414   CL 106 04/17/2011 1414   CO2 26 04/17/2011 1414   BUN 16 04/17/2011 1414   CREATININE 0.59 04/17/2011 1414      Component Value Date/Time   CALCIUM 11.1*  04/17/2011 1414   ALKPHOS 58 04/17/2011 1414   AST 17 04/17/2011 1414   ALT 18 04/17/2011 1414   BILITOT 0.6 04/17/2011 1414       RADIOGRAPHIC STUDIES:  No results found.  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 Her tumor was ER positive PR positive HER-2/neu negative with a proliferation marker 17%. She is on letrozole only she is tolerating it not so well with significant grade 2-3 arthralgias and myalgias.  #2 anxiety likely secondary to pain   PLAN:   #1 I have recommended that patient hold the letrozole for now for at least a month.  #2 I have gone ahead and given her a prescription for Cymbalta 20 mg to be taken daily to see if this helps with her myalgias and arthralgias which are is quite significant and impressive.  #3 I will plan on seeing her back in about 4-8 weeks time or   All questions were answered. The patient knows to call the clinic with any problems, questions or concerns. We can certainly see the patient much sooner if necessary.  I spent 30 minutes counseling the patient face to face. The total time spent in the appointment was 30 minutes.    [name], MD Medical/Oncology [facility] [phone] (beeper) [phone] (Office)  10/16/2011, 2:34 PM

## 2011-11-12 DIAGNOSIS — E785 Hyperlipidemia, unspecified: Secondary | ICD-10-CM | POA: Diagnosis not present

## 2011-11-12 DIAGNOSIS — M1990 Unspecified osteoarthritis, unspecified site: Secondary | ICD-10-CM | POA: Diagnosis not present

## 2011-11-12 DIAGNOSIS — I10 Essential (primary) hypertension: Secondary | ICD-10-CM | POA: Diagnosis not present

## 2011-11-12 DIAGNOSIS — E119 Type 2 diabetes mellitus without complications: Secondary | ICD-10-CM | POA: Diagnosis not present

## 2011-12-14 DIAGNOSIS — IMO0002 Reserved for concepts with insufficient information to code with codable children: Secondary | ICD-10-CM | POA: Diagnosis not present

## 2011-12-14 DIAGNOSIS — S8263XB Displaced fracture of lateral malleolus of unspecified fibula, initial encounter for open fracture type I or II: Secondary | ICD-10-CM | POA: Diagnosis not present

## 2011-12-14 DIAGNOSIS — F329 Major depressive disorder, single episode, unspecified: Secondary | ICD-10-CM | POA: Diagnosis present

## 2011-12-14 DIAGNOSIS — S6990XA Unspecified injury of unspecified wrist, hand and finger(s), initial encounter: Secondary | ICD-10-CM | POA: Diagnosis not present

## 2011-12-14 DIAGNOSIS — M25539 Pain in unspecified wrist: Secondary | ICD-10-CM | POA: Diagnosis not present

## 2011-12-14 DIAGNOSIS — S060X9A Concussion with loss of consciousness of unspecified duration, initial encounter: Secondary | ICD-10-CM | POA: Diagnosis not present

## 2011-12-14 DIAGNOSIS — R1013 Epigastric pain: Secondary | ICD-10-CM | POA: Diagnosis present

## 2011-12-14 DIAGNOSIS — E119 Type 2 diabetes mellitus without complications: Secondary | ICD-10-CM | POA: Diagnosis not present

## 2011-12-14 DIAGNOSIS — S20219A Contusion of unspecified front wall of thorax, initial encounter: Secondary | ICD-10-CM | POA: Diagnosis not present

## 2011-12-14 DIAGNOSIS — Z23 Encounter for immunization: Secondary | ICD-10-CM | POA: Diagnosis not present

## 2011-12-14 DIAGNOSIS — S82843B Displaced bimalleolar fracture of unspecified lower leg, initial encounter for open fracture type I or II: Secondary | ICD-10-CM | POA: Diagnosis not present

## 2011-12-14 DIAGNOSIS — S3981XA Other specified injuries of abdomen, initial encounter: Secondary | ICD-10-CM | POA: Diagnosis not present

## 2011-12-14 DIAGNOSIS — T148XXA Other injury of unspecified body region, initial encounter: Secondary | ICD-10-CM | POA: Diagnosis not present

## 2011-12-14 DIAGNOSIS — S82843A Displaced bimalleolar fracture of unspecified lower leg, initial encounter for closed fracture: Secondary | ICD-10-CM | POA: Diagnosis not present

## 2011-12-14 DIAGNOSIS — S93519A Sprain of interphalangeal joint of unspecified toe(s), initial encounter: Secondary | ICD-10-CM | POA: Diagnosis not present

## 2011-12-14 DIAGNOSIS — I10 Essential (primary) hypertension: Secondary | ICD-10-CM | POA: Diagnosis present

## 2011-12-14 DIAGNOSIS — I498 Other specified cardiac arrhythmias: Secondary | ICD-10-CM | POA: Diagnosis not present

## 2011-12-14 DIAGNOSIS — Z853 Personal history of malignant neoplasm of breast: Secondary | ICD-10-CM | POA: Diagnosis not present

## 2011-12-14 DIAGNOSIS — R071 Chest pain on breathing: Secondary | ICD-10-CM | POA: Diagnosis not present

## 2011-12-14 DIAGNOSIS — S298XXA Other specified injuries of thorax, initial encounter: Secondary | ICD-10-CM | POA: Diagnosis not present

## 2011-12-14 DIAGNOSIS — S0990XA Unspecified injury of head, initial encounter: Secondary | ICD-10-CM | POA: Diagnosis not present

## 2011-12-14 DIAGNOSIS — K089 Disorder of teeth and supporting structures, unspecified: Secondary | ICD-10-CM | POA: Diagnosis not present

## 2011-12-14 DIAGNOSIS — S301XXA Contusion of abdominal wall, initial encounter: Secondary | ICD-10-CM | POA: Diagnosis not present

## 2011-12-14 DIAGNOSIS — G8911 Acute pain due to trauma: Secondary | ICD-10-CM | POA: Diagnosis not present

## 2011-12-14 DIAGNOSIS — S79919A Unspecified injury of unspecified hip, initial encounter: Secondary | ICD-10-CM | POA: Diagnosis not present

## 2011-12-14 DIAGNOSIS — S199XXA Unspecified injury of neck, initial encounter: Secondary | ICD-10-CM | POA: Diagnosis not present

## 2011-12-14 DIAGNOSIS — S8990XA Unspecified injury of unspecified lower leg, initial encounter: Secondary | ICD-10-CM | POA: Diagnosis not present

## 2011-12-14 DIAGNOSIS — S79929A Unspecified injury of unspecified thigh, initial encounter: Secondary | ICD-10-CM | POA: Diagnosis not present

## 2011-12-14 DIAGNOSIS — S060XAA Concussion with loss of consciousness status unknown, initial encounter: Secondary | ICD-10-CM | POA: Diagnosis not present

## 2011-12-24 ENCOUNTER — Ambulatory Visit: Payer: Medicare Other

## 2011-12-24 DIAGNOSIS — S82843A Displaced bimalleolar fracture of unspecified lower leg, initial encounter for closed fracture: Secondary | ICD-10-CM | POA: Diagnosis not present

## 2012-01-02 ENCOUNTER — Other Ambulatory Visit: Payer: Medicare Other | Admitting: Lab

## 2012-01-02 ENCOUNTER — Encounter: Payer: Self-pay | Admitting: Oncology

## 2012-01-02 ENCOUNTER — Ambulatory Visit (HOSPITAL_BASED_OUTPATIENT_CLINIC_OR_DEPARTMENT_OTHER): Payer: Medicare Other | Admitting: Oncology

## 2012-01-02 ENCOUNTER — Telehealth: Payer: Self-pay | Admitting: Oncology

## 2012-01-02 VITALS — BP 127/86 | HR 66 | Temp 99.0°F

## 2012-01-02 DIAGNOSIS — IMO0001 Reserved for inherently not codable concepts without codable children: Secondary | ICD-10-CM | POA: Diagnosis not present

## 2012-01-02 DIAGNOSIS — F341 Dysthymic disorder: Secondary | ICD-10-CM

## 2012-01-02 DIAGNOSIS — C50119 Malignant neoplasm of central portion of unspecified female breast: Secondary | ICD-10-CM

## 2012-01-02 DIAGNOSIS — C773 Secondary and unspecified malignant neoplasm of axilla and upper limb lymph nodes: Secondary | ICD-10-CM

## 2012-01-02 DIAGNOSIS — E559 Vitamin D deficiency, unspecified: Secondary | ICD-10-CM

## 2012-01-02 DIAGNOSIS — M2550 Pain in unspecified joint: Secondary | ICD-10-CM

## 2012-01-02 DIAGNOSIS — C50919 Malignant neoplasm of unspecified site of unspecified female breast: Secondary | ICD-10-CM

## 2012-01-02 MED ORDER — DULOXETINE HCL 20 MG PO CPEP: 20.0000 mg | ORAL_CAPSULE | Freq: Every day | ORAL | Status: DC
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
## 2012-01-02 NOTE — Patient Instructions (Addendum)
1. Restart letrozole daily  2. Continue cymbalta  3. Vitamin D3 5000 units daily (over the counter)  4. I will see you back in 6 months

## 2012-01-02 NOTE — Telephone Encounter (Signed)
gve the pt her oct 2013 appt calendar °

## 2012-01-10 DIAGNOSIS — M25579 Pain in unspecified ankle and joints of unspecified foot: Secondary | ICD-10-CM | POA: Diagnosis not present

## 2012-01-10 DIAGNOSIS — S8253XA Displaced fracture of medial malleolus of unspecified tibia, initial encounter for closed fracture: Secondary | ICD-10-CM | POA: Diagnosis not present

## 2012-01-23 DIAGNOSIS — S82843A Displaced bimalleolar fracture of unspecified lower leg, initial encounter for closed fracture: Secondary | ICD-10-CM | POA: Diagnosis not present

## 2012-01-31 ENCOUNTER — Ambulatory Visit
Admission: RE | Admit: 2012-01-31 | Discharge: 2012-01-31 | Disposition: A | Discharge status: Home / Self Care | Payer: Medicare Other | Source: Ambulatory Visit | Attending: Internal Medicine | Admitting: Internal Medicine

## 2012-01-31 DIAGNOSIS — Z1231 Encounter for screening mammogram for malignant neoplasm of breast: Secondary | ICD-10-CM | POA: Diagnosis not present

## 2012-02-06 DIAGNOSIS — S82843A Displaced bimalleolar fracture of unspecified lower leg, initial encounter for closed fracture: Secondary | ICD-10-CM | POA: Diagnosis not present

## 2012-02-25 DIAGNOSIS — S82843A Displaced bimalleolar fracture of unspecified lower leg, initial encounter for closed fracture: Secondary | ICD-10-CM | POA: Diagnosis not present

## 2012-02-27 DIAGNOSIS — S82843A Displaced bimalleolar fracture of unspecified lower leg, initial encounter for closed fracture: Secondary | ICD-10-CM | POA: Diagnosis not present

## 2012-03-03 DIAGNOSIS — S82843A Displaced bimalleolar fracture of unspecified lower leg, initial encounter for closed fracture: Secondary | ICD-10-CM | POA: Diagnosis not present

## 2012-03-03 DIAGNOSIS — M25579 Pain in unspecified ankle and joints of unspecified foot: Secondary | ICD-10-CM | POA: Diagnosis not present

## 2012-03-12 DIAGNOSIS — R262 Difficulty in walking, not elsewhere classified: Secondary | ICD-10-CM | POA: Diagnosis not present

## 2012-03-12 DIAGNOSIS — S82843A Displaced bimalleolar fracture of unspecified lower leg, initial encounter for closed fracture: Secondary | ICD-10-CM | POA: Diagnosis not present

## 2012-03-13 ENCOUNTER — Other Ambulatory Visit: Payer: Self-pay | Admitting: Oncology

## 2012-03-16 DIAGNOSIS — S82843A Displaced bimalleolar fracture of unspecified lower leg, initial encounter for closed fracture: Secondary | ICD-10-CM | POA: Diagnosis not present

## 2012-03-16 DIAGNOSIS — R262 Difficulty in walking, not elsewhere classified: Secondary | ICD-10-CM | POA: Diagnosis not present

## 2012-03-18 DIAGNOSIS — S82843A Displaced bimalleolar fracture of unspecified lower leg, initial encounter for closed fracture: Secondary | ICD-10-CM | POA: Diagnosis not present

## 2012-03-18 DIAGNOSIS — R262 Difficulty in walking, not elsewhere classified: Secondary | ICD-10-CM | POA: Diagnosis not present

## 2012-03-23 DIAGNOSIS — R262 Difficulty in walking, not elsewhere classified: Secondary | ICD-10-CM | POA: Diagnosis not present

## 2012-03-23 DIAGNOSIS — S82843A Displaced bimalleolar fracture of unspecified lower leg, initial encounter for closed fracture: Secondary | ICD-10-CM | POA: Diagnosis not present

## 2012-03-26 DIAGNOSIS — M25579 Pain in unspecified ankle and joints of unspecified foot: Secondary | ICD-10-CM | POA: Diagnosis not present

## 2012-03-26 DIAGNOSIS — S82843A Displaced bimalleolar fracture of unspecified lower leg, initial encounter for closed fracture: Secondary | ICD-10-CM | POA: Diagnosis not present

## 2012-03-27 DIAGNOSIS — S82843A Displaced bimalleolar fracture of unspecified lower leg, initial encounter for closed fracture: Secondary | ICD-10-CM | POA: Diagnosis not present

## 2012-03-30 DIAGNOSIS — R262 Difficulty in walking, not elsewhere classified: Secondary | ICD-10-CM | POA: Diagnosis not present

## 2012-03-30 DIAGNOSIS — S82843A Displaced bimalleolar fracture of unspecified lower leg, initial encounter for closed fracture: Secondary | ICD-10-CM | POA: Diagnosis not present

## 2012-04-02 DIAGNOSIS — R262 Difficulty in walking, not elsewhere classified: Secondary | ICD-10-CM | POA: Diagnosis not present

## 2012-04-02 DIAGNOSIS — S82843A Displaced bimalleolar fracture of unspecified lower leg, initial encounter for closed fracture: Secondary | ICD-10-CM | POA: Diagnosis not present

## 2012-04-08 DIAGNOSIS — R262 Difficulty in walking, not elsewhere classified: Secondary | ICD-10-CM | POA: Diagnosis not present

## 2012-04-08 DIAGNOSIS — S82843A Displaced bimalleolar fracture of unspecified lower leg, initial encounter for closed fracture: Secondary | ICD-10-CM | POA: Diagnosis not present

## 2012-04-13 DIAGNOSIS — R262 Difficulty in walking, not elsewhere classified: Secondary | ICD-10-CM | POA: Diagnosis not present

## 2012-04-13 DIAGNOSIS — S82843A Displaced bimalleolar fracture of unspecified lower leg, initial encounter for closed fracture: Secondary | ICD-10-CM | POA: Diagnosis not present

## 2012-04-16 DIAGNOSIS — S82843A Displaced bimalleolar fracture of unspecified lower leg, initial encounter for closed fracture: Secondary | ICD-10-CM | POA: Diagnosis not present

## 2012-04-16 DIAGNOSIS — R262 Difficulty in walking, not elsewhere classified: Secondary | ICD-10-CM | POA: Diagnosis not present

## 2012-04-20 DIAGNOSIS — S82843A Displaced bimalleolar fracture of unspecified lower leg, initial encounter for closed fracture: Secondary | ICD-10-CM | POA: Diagnosis not present

## 2012-04-20 DIAGNOSIS — R262 Difficulty in walking, not elsewhere classified: Secondary | ICD-10-CM | POA: Diagnosis not present

## 2012-04-21 DIAGNOSIS — Z23 Encounter for immunization: Secondary | ICD-10-CM | POA: Diagnosis not present

## 2012-04-23 DIAGNOSIS — R262 Difficulty in walking, not elsewhere classified: Secondary | ICD-10-CM | POA: Diagnosis not present

## 2012-04-23 DIAGNOSIS — S82843A Displaced bimalleolar fracture of unspecified lower leg, initial encounter for closed fracture: Secondary | ICD-10-CM | POA: Diagnosis not present

## 2012-04-27 DIAGNOSIS — R262 Difficulty in walking, not elsewhere classified: Secondary | ICD-10-CM | POA: Diagnosis not present

## 2012-04-27 DIAGNOSIS — S82843A Displaced bimalleolar fracture of unspecified lower leg, initial encounter for closed fracture: Secondary | ICD-10-CM | POA: Diagnosis not present

## 2012-04-30 DIAGNOSIS — R262 Difficulty in walking, not elsewhere classified: Secondary | ICD-10-CM | POA: Diagnosis not present

## 2012-04-30 DIAGNOSIS — S82843A Displaced bimalleolar fracture of unspecified lower leg, initial encounter for closed fracture: Secondary | ICD-10-CM | POA: Diagnosis not present

## 2012-04-30 DIAGNOSIS — M654 Radial styloid tenosynovitis [de Quervain]: Secondary | ICD-10-CM | POA: Diagnosis not present

## 2012-04-30 DIAGNOSIS — S8253XA Displaced fracture of medial malleolus of unspecified tibia, initial encounter for closed fracture: Secondary | ICD-10-CM | POA: Diagnosis not present

## 2012-05-04 DIAGNOSIS — S82843A Displaced bimalleolar fracture of unspecified lower leg, initial encounter for closed fracture: Secondary | ICD-10-CM | POA: Diagnosis not present

## 2012-05-04 DIAGNOSIS — R262 Difficulty in walking, not elsewhere classified: Secondary | ICD-10-CM | POA: Diagnosis not present

## 2012-05-07 DIAGNOSIS — R262 Difficulty in walking, not elsewhere classified: Secondary | ICD-10-CM | POA: Diagnosis not present

## 2012-05-07 DIAGNOSIS — S82843A Displaced bimalleolar fracture of unspecified lower leg, initial encounter for closed fracture: Secondary | ICD-10-CM | POA: Diagnosis not present

## 2012-05-11 DIAGNOSIS — R262 Difficulty in walking, not elsewhere classified: Secondary | ICD-10-CM | POA: Diagnosis not present

## 2012-05-11 DIAGNOSIS — S82843A Displaced bimalleolar fracture of unspecified lower leg, initial encounter for closed fracture: Secondary | ICD-10-CM | POA: Diagnosis not present

## 2012-05-12 DIAGNOSIS — E119 Type 2 diabetes mellitus without complications: Secondary | ICD-10-CM | POA: Diagnosis not present

## 2012-05-12 DIAGNOSIS — H2510 Age-related nuclear cataract, unspecified eye: Secondary | ICD-10-CM | POA: Diagnosis not present

## 2012-05-13 ENCOUNTER — Other Ambulatory Visit: Payer: Self-pay | Admitting: *Deleted

## 2012-05-13 DIAGNOSIS — S82843A Displaced bimalleolar fracture of unspecified lower leg, initial encounter for closed fracture: Secondary | ICD-10-CM | POA: Diagnosis not present

## 2012-05-13 DIAGNOSIS — M2550 Pain in unspecified joint: Secondary | ICD-10-CM

## 2012-05-13 DIAGNOSIS — R262 Difficulty in walking, not elsewhere classified: Secondary | ICD-10-CM | POA: Diagnosis not present

## 2012-05-13 MED ORDER — DULOXETINE HCL 20 MG PO CPEP: 20.0000 mg | ORAL_CAPSULE | Freq: Every day | ORAL | Status: DC

## 2012-05-13 NOTE — Telephone Encounter (Signed)
[facility] sent [facility].

## 2012-05-13 NOTE — Telephone Encounter (Signed)
Ok to do cymbalta by mail order

## 2012-05-13 NOTE — Telephone Encounter (Signed)
Pt called request Cymbalta refill 20 mg be sent to [facility].

## 2012-05-13 NOTE — Telephone Encounter (Signed)
Pt called request Cymbalta 20mg  refill sent to  [facility]  q for 3 month supply. Will review with MD

## 2012-05-26 DIAGNOSIS — S82843A Displaced bimalleolar fracture of unspecified lower leg, initial encounter for closed fracture: Secondary | ICD-10-CM | POA: Diagnosis not present

## 2012-05-26 DIAGNOSIS — E119 Type 2 diabetes mellitus without complications: Secondary | ICD-10-CM | POA: Diagnosis not present

## 2012-05-26 DIAGNOSIS — E785 Hyperlipidemia, unspecified: Secondary | ICD-10-CM | POA: Diagnosis not present

## 2012-05-26 DIAGNOSIS — R262 Difficulty in walking, not elsewhere classified: Secondary | ICD-10-CM | POA: Diagnosis not present

## 2012-05-26 DIAGNOSIS — I10 Essential (primary) hypertension: Secondary | ICD-10-CM | POA: Diagnosis not present

## 2012-05-28 DIAGNOSIS — S82843A Displaced bimalleolar fracture of unspecified lower leg, initial encounter for closed fracture: Secondary | ICD-10-CM | POA: Diagnosis not present

## 2012-05-28 DIAGNOSIS — R262 Difficulty in walking, not elsewhere classified: Secondary | ICD-10-CM | POA: Diagnosis not present

## 2012-05-29 DIAGNOSIS — E785 Hyperlipidemia, unspecified: Secondary | ICD-10-CM | POA: Diagnosis not present

## 2012-05-29 DIAGNOSIS — E119 Type 2 diabetes mellitus without complications: Secondary | ICD-10-CM | POA: Diagnosis not present

## 2012-05-29 DIAGNOSIS — C50919 Malignant neoplasm of unspecified site of unspecified female breast: Secondary | ICD-10-CM | POA: Diagnosis not present

## 2012-05-29 DIAGNOSIS — Z0000 Encounter for general adult medical examination without abnormal findings: Secondary | ICD-10-CM | POA: Diagnosis not present

## 2012-05-29 DIAGNOSIS — Z23 Encounter for immunization: Secondary | ICD-10-CM | POA: Diagnosis not present

## 2012-06-02 DIAGNOSIS — S82843A Displaced bimalleolar fracture of unspecified lower leg, initial encounter for closed fracture: Secondary | ICD-10-CM | POA: Diagnosis not present

## 2012-06-02 DIAGNOSIS — R262 Difficulty in walking, not elsewhere classified: Secondary | ICD-10-CM | POA: Diagnosis not present

## 2012-06-04 DIAGNOSIS — S82843A Displaced bimalleolar fracture of unspecified lower leg, initial encounter for closed fracture: Secondary | ICD-10-CM | POA: Diagnosis not present

## 2012-06-04 DIAGNOSIS — R262 Difficulty in walking, not elsewhere classified: Secondary | ICD-10-CM | POA: Diagnosis not present

## 2012-06-04 DIAGNOSIS — Z1212 Encounter for screening for malignant neoplasm of rectum: Secondary | ICD-10-CM | POA: Diagnosis not present

## 2012-06-22 DIAGNOSIS — R262 Difficulty in walking, not elsewhere classified: Secondary | ICD-10-CM | POA: Diagnosis not present

## 2012-06-22 DIAGNOSIS — S82843A Displaced bimalleolar fracture of unspecified lower leg, initial encounter for closed fracture: Secondary | ICD-10-CM | POA: Diagnosis not present

## 2012-06-25 DIAGNOSIS — R262 Difficulty in walking, not elsewhere classified: Secondary | ICD-10-CM | POA: Diagnosis not present

## 2012-06-25 DIAGNOSIS — S82843A Displaced bimalleolar fracture of unspecified lower leg, initial encounter for closed fracture: Secondary | ICD-10-CM | POA: Diagnosis not present

## 2012-06-29 DIAGNOSIS — S82843A Displaced bimalleolar fracture of unspecified lower leg, initial encounter for closed fracture: Secondary | ICD-10-CM | POA: Diagnosis not present

## 2012-06-29 DIAGNOSIS — R262 Difficulty in walking, not elsewhere classified: Secondary | ICD-10-CM | POA: Diagnosis not present

## 2012-06-30 DIAGNOSIS — M654 Radial styloid tenosynovitis [de Quervain]: Secondary | ICD-10-CM | POA: Diagnosis not present

## 2012-07-02 ENCOUNTER — Other Ambulatory Visit (HOSPITAL_BASED_OUTPATIENT_CLINIC_OR_DEPARTMENT_OTHER): Payer: Medicare Other | Admitting: Lab

## 2012-07-02 ENCOUNTER — Encounter: Payer: Self-pay | Admitting: Oncology

## 2012-07-02 ENCOUNTER — Telehealth: Payer: Self-pay | Admitting: *Deleted

## 2012-07-02 ENCOUNTER — Ambulatory Visit (HOSPITAL_BASED_OUTPATIENT_CLINIC_OR_DEPARTMENT_OTHER): Payer: Medicare Other | Admitting: Oncology

## 2012-07-02 VITALS — BP 146/84 | HR 66 | Temp 98.5°F | Resp 20 | Ht 66.0 in | Wt 189.0 lb

## 2012-07-02 DIAGNOSIS — Z170 Estrogen receptor positive status [ER+]: Secondary | ICD-10-CM | POA: Diagnosis not present

## 2012-07-02 DIAGNOSIS — E559 Vitamin D deficiency, unspecified: Secondary | ICD-10-CM | POA: Diagnosis not present

## 2012-07-02 DIAGNOSIS — F329 Major depressive disorder, single episode, unspecified: Secondary | ICD-10-CM

## 2012-07-02 DIAGNOSIS — S82843A Displaced bimalleolar fracture of unspecified lower leg, initial encounter for closed fracture: Secondary | ICD-10-CM | POA: Diagnosis not present

## 2012-07-02 DIAGNOSIS — C50919 Malignant neoplasm of unspecified site of unspecified female breast: Secondary | ICD-10-CM

## 2012-07-02 DIAGNOSIS — M2550 Pain in unspecified joint: Secondary | ICD-10-CM

## 2012-07-02 DIAGNOSIS — R262 Difficulty in walking, not elsewhere classified: Secondary | ICD-10-CM | POA: Diagnosis not present

## 2012-07-02 LAB — CBC WITH DIFFERENTIAL/PLATELET
BASO%: 1 % (ref 0.0–2.0)
EOS%: 2.7 % (ref 0.0–7.0)
MCH: 31.3 pg (ref 25.1–34.0)
MCHC: 34.3 g/dL (ref 31.5–36.0)
RBC: 4.44 10*6/uL (ref 3.70–5.45)
RDW: 14.2 % (ref 11.2–14.5)
lymph#: 1.7 10*3/uL (ref 0.9–3.3)

## 2012-07-02 LAB — COMPREHENSIVE METABOLIC PANEL (CC13)
AST: 22 U/L (ref 5–34)
Albumin: 4.3 g/dL (ref 3.5–5.0)
Alkaline Phosphatase: 84 U/L (ref 40–150)
Potassium: 4.3 mEq/L (ref 3.5–5.1)
Sodium: 143 mEq/L (ref 136–145)
Total Protein: 7.3 g/dL (ref 6.4–8.3)

## 2012-07-02 NOTE — Patient Instructions (Addendum)
Continue letrozole daily  We will see you back in 6 months

## 2012-07-02 NOTE — Telephone Encounter (Signed)
Gave patient appointment for 12-2012 
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
## 2012-07-03 ENCOUNTER — Telehealth: Payer: Self-pay | Admitting: *Deleted

## 2012-07-03 NOTE — Telephone Encounter (Signed)
Per MD, Notified pt lmovm, Vitatmin D3 good. Request pt call back to confimr message rcvd.

## 2012-07-03 NOTE — Telephone Encounter (Signed)
Message copied by Callan Norden, Gerald Leitz on Fri Jul 03, 2012  9:34 AM ------      Message from: Victorino December      Created: Fri Jul 03, 2012  9:00 AM       Call patient: vitamin d3 good

## 2012-07-08 DIAGNOSIS — R262 Difficulty in walking, not elsewhere classified: Secondary | ICD-10-CM | POA: Diagnosis not present

## 2012-07-08 DIAGNOSIS — S82843A Displaced bimalleolar fracture of unspecified lower leg, initial encounter for closed fracture: Secondary | ICD-10-CM | POA: Diagnosis not present

## 2012-07-10 DIAGNOSIS — S82843A Displaced bimalleolar fracture of unspecified lower leg, initial encounter for closed fracture: Secondary | ICD-10-CM | POA: Diagnosis not present

## 2012-07-10 DIAGNOSIS — R262 Difficulty in walking, not elsewhere classified: Secondary | ICD-10-CM | POA: Diagnosis not present

## 2012-07-13 DIAGNOSIS — S82843A Displaced bimalleolar fracture of unspecified lower leg, initial encounter for closed fracture: Secondary | ICD-10-CM | POA: Diagnosis not present

## 2012-07-13 DIAGNOSIS — R262 Difficulty in walking, not elsewhere classified: Secondary | ICD-10-CM | POA: Diagnosis not present

## 2012-07-16 DIAGNOSIS — R262 Difficulty in walking, not elsewhere classified: Secondary | ICD-10-CM | POA: Diagnosis not present

## 2012-07-16 DIAGNOSIS — S82843A Displaced bimalleolar fracture of unspecified lower leg, initial encounter for closed fracture: Secondary | ICD-10-CM | POA: Diagnosis not present

## 2012-07-20 DIAGNOSIS — R262 Difficulty in walking, not elsewhere classified: Secondary | ICD-10-CM | POA: Diagnosis not present

## 2012-07-20 DIAGNOSIS — S82843A Displaced bimalleolar fracture of unspecified lower leg, initial encounter for closed fracture: Secondary | ICD-10-CM | POA: Diagnosis not present

## 2012-08-06 DIAGNOSIS — R262 Difficulty in walking, not elsewhere classified: Secondary | ICD-10-CM | POA: Diagnosis not present

## 2012-08-06 DIAGNOSIS — S82843A Displaced bimalleolar fracture of unspecified lower leg, initial encounter for closed fracture: Secondary | ICD-10-CM | POA: Diagnosis not present

## 2012-09-11 DIAGNOSIS — E119 Type 2 diabetes mellitus without complications: Secondary | ICD-10-CM | POA: Diagnosis not present

## 2012-09-11 DIAGNOSIS — H532 Diplopia: Secondary | ICD-10-CM | POA: Diagnosis not present

## 2012-09-11 DIAGNOSIS — H2510 Age-related nuclear cataract, unspecified eye: Secondary | ICD-10-CM | POA: Diagnosis not present

## 2012-09-14 DIAGNOSIS — H532 Diplopia: Secondary | ICD-10-CM | POA: Diagnosis not present

## 2012-09-15 DIAGNOSIS — E119 Type 2 diabetes mellitus without complications: Secondary | ICD-10-CM | POA: Diagnosis not present

## 2012-09-15 DIAGNOSIS — H532 Diplopia: Secondary | ICD-10-CM | POA: Diagnosis not present

## 2012-09-15 DIAGNOSIS — J0100 Acute maxillary sinusitis, unspecified: Secondary | ICD-10-CM | POA: Diagnosis not present

## 2012-09-15 DIAGNOSIS — I10 Essential (primary) hypertension: Secondary | ICD-10-CM | POA: Diagnosis not present

## 2012-09-18 DIAGNOSIS — H2510 Age-related nuclear cataract, unspecified eye: Secondary | ICD-10-CM | POA: Diagnosis not present

## 2012-09-18 DIAGNOSIS — H532 Diplopia: Secondary | ICD-10-CM | POA: Diagnosis not present

## 2012-10-30 DIAGNOSIS — H532 Diplopia: Secondary | ICD-10-CM | POA: Diagnosis not present

## 2012-10-30 DIAGNOSIS — H2510 Age-related nuclear cataract, unspecified eye: Secondary | ICD-10-CM | POA: Diagnosis not present

## 2012-11-30 DIAGNOSIS — E669 Obesity, unspecified: Secondary | ICD-10-CM | POA: Diagnosis not present

## 2012-11-30 DIAGNOSIS — E1169 Type 2 diabetes mellitus with other specified complication: Secondary | ICD-10-CM | POA: Diagnosis not present

## 2012-11-30 DIAGNOSIS — C50919 Malignant neoplasm of unspecified site of unspecified female breast: Secondary | ICD-10-CM | POA: Diagnosis not present

## 2012-11-30 DIAGNOSIS — E785 Hyperlipidemia, unspecified: Secondary | ICD-10-CM | POA: Diagnosis not present

## 2012-11-30 DIAGNOSIS — H532 Diplopia: Secondary | ICD-10-CM | POA: Diagnosis not present

## 2012-11-30 DIAGNOSIS — Z6830 Body mass index (BMI) 30.0-30.9, adult: Secondary | ICD-10-CM | POA: Diagnosis not present

## 2012-11-30 DIAGNOSIS — Z1331 Encounter for screening for depression: Secondary | ICD-10-CM | POA: Diagnosis not present

## 2012-11-30 DIAGNOSIS — I10 Essential (primary) hypertension: Secondary | ICD-10-CM | POA: Diagnosis not present

## 2012-12-01 ENCOUNTER — Other Ambulatory Visit: Payer: Self-pay

## 2012-12-01 DIAGNOSIS — Z853 Personal history of malignant neoplasm of breast: Secondary | ICD-10-CM

## 2012-12-24 DIAGNOSIS — L570 Actinic keratosis: Secondary | ICD-10-CM | POA: Diagnosis not present

## 2012-12-24 DIAGNOSIS — D485 Neoplasm of uncertain behavior of skin: Secondary | ICD-10-CM | POA: Diagnosis not present

## 2012-12-24 DIAGNOSIS — L259 Unspecified contact dermatitis, unspecified cause: Secondary | ICD-10-CM | POA: Diagnosis not present

## 2012-12-29 DIAGNOSIS — Z79899 Other long term (current) drug therapy: Secondary | ICD-10-CM | POA: Diagnosis not present

## 2012-12-29 DIAGNOSIS — M899 Disorder of bone, unspecified: Secondary | ICD-10-CM | POA: Diagnosis not present

## 2013-01-01 ENCOUNTER — Ambulatory Visit: Payer: Medicare Other | Admitting: Oncology

## 2013-01-01 ENCOUNTER — Other Ambulatory Visit: Payer: Medicare Other | Admitting: Lab

## 2013-01-07 ENCOUNTER — Ambulatory Visit (HOSPITAL_BASED_OUTPATIENT_CLINIC_OR_DEPARTMENT_OTHER): Payer: Medicare Other | Admitting: Oncology

## 2013-01-07 ENCOUNTER — Telehealth: Payer: Self-pay | Admitting: Oncology

## 2013-01-07 ENCOUNTER — Other Ambulatory Visit (HOSPITAL_BASED_OUTPATIENT_CLINIC_OR_DEPARTMENT_OTHER): Payer: Medicare Other | Admitting: Lab

## 2013-01-07 VITALS — BP 143/86 | HR 67 | Temp 98.3°F | Resp 20 | Ht 66.0 in | Wt 188.2 lb

## 2013-01-07 DIAGNOSIS — M2550 Pain in unspecified joint: Secondary | ICD-10-CM | POA: Diagnosis not present

## 2013-01-07 DIAGNOSIS — C50919 Malignant neoplasm of unspecified site of unspecified female breast: Secondary | ICD-10-CM | POA: Diagnosis not present

## 2013-01-07 DIAGNOSIS — C50912 Malignant neoplasm of unspecified site of left female breast: Secondary | ICD-10-CM

## 2013-01-07 LAB — CBC WITH DIFFERENTIAL/PLATELET
Basophils Absolute: 0 10*3/uL (ref 0.0–0.1)
EOS%: 2.1 % (ref 0.0–7.0)
HGB: 13.8 g/dL (ref 11.6–15.9)
LYMPH%: 33.3 % (ref 14.0–49.7)
MCH: 30.6 pg (ref 25.1–34.0)
MCV: 91 fL (ref 79.5–101.0)
MONO%: 10.4 % (ref 0.0–14.0)
Platelets: 242 10*3/uL (ref 145–400)
RBC: 4.53 10*6/uL (ref 3.70–5.45)
RDW: 13.8 % (ref 11.2–14.5)

## 2013-01-07 LAB — COMPREHENSIVE METABOLIC PANEL (CC13)
AST: 24 U/L (ref 5–34)
Albumin: 4.3 g/dL (ref 3.5–5.0)
Alkaline Phosphatase: 76 U/L (ref 40–150)
BUN: 20.8 mg/dL (ref 7.0–26.0)
Creatinine: 0.7 mg/dL (ref 0.6–1.1)
Potassium: 4.5 mEq/L (ref 3.5–5.1)
Total Bilirubin: 0.69 mg/dL (ref 0.20–1.20)

## 2013-01-07 MED ORDER — LETROZOLE 2.5 MG PO TABS: ORAL_TABLET | ORAL | Status: DC

## 2013-01-07 NOTE — Patient Instructions (Addendum)
Doing well Continue letrozole  I will see you back in 1 year

## 2013-01-07 NOTE — Telephone Encounter (Signed)
gv pt appt schedule for July 2015.  °
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
## 2013-01-28 DIAGNOSIS — M25579 Pain in unspecified ankle and joints of unspecified foot: Secondary | ICD-10-CM | POA: Diagnosis not present

## 2013-01-29 DIAGNOSIS — L0291 Cutaneous abscess, unspecified: Secondary | ICD-10-CM | POA: Diagnosis not present

## 2013-02-01 ENCOUNTER — Ambulatory Visit
Admission: RE | Admit: 2013-02-01 | Discharge: 2013-02-01 | Disposition: A | Discharge status: Home / Self Care | Payer: Medicare Other | Source: Ambulatory Visit

## 2013-02-01 DIAGNOSIS — Z1231 Encounter for screening mammogram for malignant neoplasm of breast: Secondary | ICD-10-CM | POA: Diagnosis not present

## 2013-02-01 DIAGNOSIS — Z853 Personal history of malignant neoplasm of breast: Secondary | ICD-10-CM

## 2013-02-03 ENCOUNTER — Other Ambulatory Visit: Payer: Self-pay | Admitting: Oncology

## 2013-02-03 DIAGNOSIS — R928 Other abnormal and inconclusive findings on diagnostic imaging of breast: Secondary | ICD-10-CM

## 2013-02-03 DIAGNOSIS — L0291 Cutaneous abscess, unspecified: Secondary | ICD-10-CM | POA: Diagnosis not present

## 2013-03-01 ENCOUNTER — Ambulatory Visit
Admission: RE | Admit: 2013-03-01 | Discharge: 2013-03-01 | Disposition: A | Discharge status: Home / Self Care | Payer: Medicare Other | Source: Ambulatory Visit | Attending: Oncology | Admitting: Oncology

## 2013-03-01 DIAGNOSIS — R922 Inconclusive mammogram: Secondary | ICD-10-CM | POA: Diagnosis not present

## 2013-03-01 DIAGNOSIS — R928 Other abnormal and inconclusive findings on diagnostic imaging of breast: Secondary | ICD-10-CM

## 2013-03-02 ENCOUNTER — Other Ambulatory Visit: Payer: Self-pay | Admitting: Medical Oncology

## 2013-03-02 DIAGNOSIS — H2510 Age-related nuclear cataract, unspecified eye: Secondary | ICD-10-CM | POA: Diagnosis not present

## 2013-03-02 DIAGNOSIS — E119 Type 2 diabetes mellitus without complications: Secondary | ICD-10-CM | POA: Diagnosis not present

## 2013-03-02 DIAGNOSIS — T451X5A Adverse effect of antineoplastic and immunosuppressive drugs, initial encounter: Secondary | ICD-10-CM

## 2013-03-02 DIAGNOSIS — H532 Diplopia: Secondary | ICD-10-CM | POA: Diagnosis not present

## 2013-03-02 DIAGNOSIS — M2550 Pain in unspecified joint: Secondary | ICD-10-CM

## 2013-03-02 MED ORDER — DULOXETINE HCL 20 MG PO CPEP: 20.0000 mg | ORAL_CAPSULE | Freq: Every day | ORAL | Status: DC

## 2013-03-02 NOTE — Telephone Encounter (Signed)
Pt came in for refill for Cymbalta, needs enough for this week from her local pharmacy and until [facility]. Per MD, ok to refill. Patient informed. Denies further questions at this time.

## 2013-03-02 NOTE — Progress Notes (Signed)
Quick Note:  Call patient: mammogram normal ______

## 2013-03-08 ENCOUNTER — Telehealth: Payer: Self-pay | Admitting: Medical Oncology

## 2013-03-08 NOTE — Telephone Encounter (Signed)
LVMOM. Per MD, informed patient mammogram results normal. Patient to return call should she have any questions or concerns.

## 2013-03-08 NOTE — Telephone Encounter (Signed)
Message copied by Rexene Edison on Mon Mar 08, 2013 10:04 AM ------      Message from: Victorino December      Created: Tue Mar 02, 2013  4:57 PM       Call patient: mammogram normal ------

## 2013-04-09 DIAGNOSIS — Z23 Encounter for immunization: Secondary | ICD-10-CM | POA: Diagnosis not present

## 2013-05-31 DIAGNOSIS — R82998 Other abnormal findings in urine: Secondary | ICD-10-CM | POA: Diagnosis not present

## 2013-05-31 DIAGNOSIS — M899 Disorder of bone, unspecified: Secondary | ICD-10-CM | POA: Diagnosis not present

## 2013-05-31 DIAGNOSIS — E785 Hyperlipidemia, unspecified: Secondary | ICD-10-CM | POA: Diagnosis not present

## 2013-05-31 DIAGNOSIS — E119 Type 2 diabetes mellitus without complications: Secondary | ICD-10-CM | POA: Diagnosis not present

## 2013-05-31 DIAGNOSIS — I10 Essential (primary) hypertension: Secondary | ICD-10-CM | POA: Diagnosis not present

## 2013-06-01 DIAGNOSIS — H11429 Conjunctival edema, unspecified eye: Secondary | ICD-10-CM | POA: Diagnosis not present

## 2013-06-01 DIAGNOSIS — H2510 Age-related nuclear cataract, unspecified eye: Secondary | ICD-10-CM | POA: Diagnosis not present

## 2013-06-01 DIAGNOSIS — H40059 Ocular hypertension, unspecified eye: Secondary | ICD-10-CM | POA: Diagnosis not present

## 2013-06-01 DIAGNOSIS — H05249 Constant exophthalmos, unspecified eye: Secondary | ICD-10-CM | POA: Diagnosis not present

## 2013-06-01 DIAGNOSIS — H1130 Conjunctival hemorrhage, unspecified eye: Secondary | ICD-10-CM | POA: Diagnosis not present

## 2013-06-07 DIAGNOSIS — M899 Disorder of bone, unspecified: Secondary | ICD-10-CM | POA: Diagnosis not present

## 2013-06-07 DIAGNOSIS — E785 Hyperlipidemia, unspecified: Secondary | ICD-10-CM | POA: Diagnosis not present

## 2013-06-07 DIAGNOSIS — Z0000 Encounter for general adult medical examination without abnormal findings: Secondary | ICD-10-CM | POA: Diagnosis not present

## 2013-06-07 DIAGNOSIS — H1130 Conjunctival hemorrhage, unspecified eye: Secondary | ICD-10-CM | POA: Diagnosis not present

## 2013-06-07 DIAGNOSIS — I10 Essential (primary) hypertension: Secondary | ICD-10-CM | POA: Diagnosis not present

## 2013-06-07 DIAGNOSIS — C50919 Malignant neoplasm of unspecified site of unspecified female breast: Secondary | ICD-10-CM | POA: Diagnosis not present

## 2013-06-07 DIAGNOSIS — E119 Type 2 diabetes mellitus without complications: Secondary | ICD-10-CM | POA: Diagnosis not present

## 2013-06-14 DIAGNOSIS — Z1212 Encounter for screening for malignant neoplasm of rectum: Secondary | ICD-10-CM | POA: Diagnosis not present

## 2013-06-22 DIAGNOSIS — H0520 Unspecified exophthalmos: Secondary | ICD-10-CM | POA: Diagnosis not present

## 2013-06-22 DIAGNOSIS — H532 Diplopia: Secondary | ICD-10-CM | POA: Diagnosis not present

## 2013-06-23 DIAGNOSIS — H0520 Unspecified exophthalmos: Secondary | ICD-10-CM | POA: Diagnosis not present

## 2013-06-25 DIAGNOSIS — Z1329 Encounter for screening for other suspected endocrine disorder: Secondary | ICD-10-CM | POA: Diagnosis not present

## 2013-06-28 DIAGNOSIS — Z1329 Encounter for screening for other suspected endocrine disorder: Secondary | ICD-10-CM | POA: Diagnosis not present

## 2013-07-08 DIAGNOSIS — M629 Disorder of muscle, unspecified: Secondary | ICD-10-CM | POA: Diagnosis not present

## 2013-07-08 DIAGNOSIS — H2510 Age-related nuclear cataract, unspecified eye: Secondary | ICD-10-CM | POA: Diagnosis not present

## 2013-07-08 DIAGNOSIS — H11429 Conjunctival edema, unspecified eye: Secondary | ICD-10-CM | POA: Diagnosis not present

## 2013-07-08 DIAGNOSIS — H532 Diplopia: Secondary | ICD-10-CM | POA: Diagnosis not present

## 2013-07-08 DIAGNOSIS — E0500 Thyrotoxicosis with diffuse goiter without thyrotoxic crisis or storm: Secondary | ICD-10-CM | POA: Diagnosis not present

## 2013-08-12 DIAGNOSIS — E0500 Thyrotoxicosis with diffuse goiter without thyrotoxic crisis or storm: Secondary | ICD-10-CM | POA: Diagnosis not present

## 2013-08-12 DIAGNOSIS — I770 Arteriovenous fistula, acquired: Secondary | ICD-10-CM | POA: Diagnosis not present

## 2013-08-12 DIAGNOSIS — H04209 Unspecified epiphora, unspecified lacrimal gland: Secondary | ICD-10-CM | POA: Diagnosis not present

## 2013-08-12 DIAGNOSIS — H539 Unspecified visual disturbance: Secondary | ICD-10-CM | POA: Diagnosis not present

## 2013-08-12 DIAGNOSIS — H5789 Other specified disorders of eye and adnexa: Secondary | ICD-10-CM | POA: Diagnosis not present

## 2013-08-16 DIAGNOSIS — I770 Arteriovenous fistula, acquired: Secondary | ICD-10-CM | POA: Diagnosis not present

## 2013-08-16 DIAGNOSIS — Z882 Allergy status to sulfonamides status: Secondary | ICD-10-CM | POA: Diagnosis not present

## 2013-08-16 DIAGNOSIS — Z7982 Long term (current) use of aspirin: Secondary | ICD-10-CM | POA: Diagnosis not present

## 2013-09-07 DIAGNOSIS — H05249 Constant exophthalmos, unspecified eye: Secondary | ICD-10-CM | POA: Diagnosis not present

## 2013-09-07 DIAGNOSIS — H40059 Ocular hypertension, unspecified eye: Secondary | ICD-10-CM | POA: Diagnosis not present

## 2013-09-07 DIAGNOSIS — H2510 Age-related nuclear cataract, unspecified eye: Secondary | ICD-10-CM | POA: Diagnosis not present

## 2013-09-08 DIAGNOSIS — I770 Arteriovenous fistula, acquired: Secondary | ICD-10-CM | POA: Diagnosis not present

## 2013-09-08 DIAGNOSIS — I671 Cerebral aneurysm, nonruptured: Secondary | ICD-10-CM | POA: Diagnosis not present

## 2013-09-09 DIAGNOSIS — I770 Arteriovenous fistula, acquired: Secondary | ICD-10-CM | POA: Diagnosis not present

## 2013-10-26 DIAGNOSIS — I770 Arteriovenous fistula, acquired: Secondary | ICD-10-CM | POA: Diagnosis not present

## 2013-10-26 DIAGNOSIS — E0500 Thyrotoxicosis with diffuse goiter without thyrotoxic crisis or storm: Secondary | ICD-10-CM | POA: Diagnosis not present

## 2013-11-02 DIAGNOSIS — M19079 Primary osteoarthritis, unspecified ankle and foot: Secondary | ICD-10-CM | POA: Diagnosis not present

## 2013-11-03 DIAGNOSIS — I671 Cerebral aneurysm, nonruptured: Secondary | ICD-10-CM | POA: Diagnosis not present

## 2013-11-03 DIAGNOSIS — I770 Arteriovenous fistula, acquired: Secondary | ICD-10-CM | POA: Diagnosis not present

## 2013-12-13 ENCOUNTER — Encounter: Payer: Self-pay | Admitting: Internal Medicine

## 2013-12-13 DIAGNOSIS — Z23 Encounter for immunization: Secondary | ICD-10-CM | POA: Diagnosis not present

## 2013-12-13 DIAGNOSIS — E785 Hyperlipidemia, unspecified: Secondary | ICD-10-CM | POA: Diagnosis not present

## 2013-12-13 DIAGNOSIS — E669 Obesity, unspecified: Secondary | ICD-10-CM | POA: Diagnosis not present

## 2013-12-13 DIAGNOSIS — Z1331 Encounter for screening for depression: Secondary | ICD-10-CM | POA: Diagnosis not present

## 2013-12-13 DIAGNOSIS — H1130 Conjunctival hemorrhage, unspecified eye: Secondary | ICD-10-CM | POA: Diagnosis not present

## 2013-12-13 DIAGNOSIS — I10 Essential (primary) hypertension: Secondary | ICD-10-CM | POA: Diagnosis not present

## 2013-12-13 DIAGNOSIS — E119 Type 2 diabetes mellitus without complications: Secondary | ICD-10-CM | POA: Diagnosis not present

## 2013-12-13 DIAGNOSIS — M1990 Unspecified osteoarthritis, unspecified site: Secondary | ICD-10-CM | POA: Diagnosis not present

## 2013-12-23 ENCOUNTER — Telehealth: Payer: Self-pay | Admitting: Hematology and Oncology

## 2013-12-28 DIAGNOSIS — Q283 Other malformations of cerebral vessels: Secondary | ICD-10-CM | POA: Diagnosis not present

## 2013-12-28 DIAGNOSIS — E0500 Thyrotoxicosis with diffuse goiter without thyrotoxic crisis or storm: Secondary | ICD-10-CM | POA: Diagnosis not present

## 2013-12-28 DIAGNOSIS — I770 Arteriovenous fistula, acquired: Secondary | ICD-10-CM | POA: Diagnosis not present

## 2014-01-06 ENCOUNTER — Ambulatory Visit: Payer: Medicare Other | Admitting: Oncology

## 2014-01-06 ENCOUNTER — Telehealth: Payer: Self-pay | Admitting: Hematology and Oncology

## 2014-01-06 ENCOUNTER — Other Ambulatory Visit: Payer: Medicare Other

## 2014-01-06 NOTE — Telephone Encounter (Signed)
pt cld & left a VM returned call and left message to adv pt to call us back in re to Oct appt

## 2014-01-11 DIAGNOSIS — K648 Other hemorrhoids: Secondary | ICD-10-CM | POA: Diagnosis not present

## 2014-01-11 DIAGNOSIS — Z800 Family history of malignant neoplasm of digestive organs: Secondary | ICD-10-CM | POA: Diagnosis not present

## 2014-01-11 DIAGNOSIS — Z1211 Encounter for screening for malignant neoplasm of colon: Secondary | ICD-10-CM | POA: Diagnosis not present

## 2014-01-18 ENCOUNTER — Telehealth: Payer: Self-pay | Admitting: Hematology and Oncology

## 2014-01-18 NOTE — Telephone Encounter (Signed)
Returned pt call-spoke to pt -pt wanted to r/s appt and req to see GM not Gudena-adv i could r/s appt-gave pt time 7 date of appt-pt understood

## 2014-01-31 ENCOUNTER — Other Ambulatory Visit: Payer: Self-pay

## 2014-01-31 DIAGNOSIS — Z1231 Encounter for screening mammogram for malignant neoplasm of breast: Secondary | ICD-10-CM

## 2014-01-31 DIAGNOSIS — Z9012 Acquired absence of left breast and nipple: Secondary | ICD-10-CM

## 2014-02-09 DIAGNOSIS — H40059 Ocular hypertension, unspecified eye: Secondary | ICD-10-CM | POA: Diagnosis not present

## 2014-02-09 DIAGNOSIS — H5000 Unspecified esotropia: Secondary | ICD-10-CM | POA: Diagnosis not present

## 2014-02-09 DIAGNOSIS — I671 Cerebral aneurysm, nonruptured: Secondary | ICD-10-CM | POA: Diagnosis not present

## 2014-02-09 DIAGNOSIS — H2510 Age-related nuclear cataract, unspecified eye: Secondary | ICD-10-CM | POA: Diagnosis not present

## 2014-03-02 ENCOUNTER — Encounter: Payer: Medicare Other | Admitting: Internal Medicine

## 2014-03-10 ENCOUNTER — Ambulatory Visit
Admission: RE | Admit: 2014-03-10 | Discharge: 2014-03-10 | Disposition: A | Discharge status: Home / Self Care | Payer: Medicare Other | Source: Ambulatory Visit

## 2014-03-10 ENCOUNTER — Encounter (INDEPENDENT_AMBULATORY_CARE_PROVIDER_SITE_OTHER): Payer: Self-pay

## 2014-03-10 DIAGNOSIS — Z1231 Encounter for screening mammogram for malignant neoplasm of breast: Secondary | ICD-10-CM

## 2014-03-10 DIAGNOSIS — Z9012 Acquired absence of left breast and nipple: Secondary | ICD-10-CM

## 2014-04-07 ENCOUNTER — Other Ambulatory Visit: Payer: Medicare Other

## 2014-04-07 ENCOUNTER — Ambulatory Visit: Payer: Medicare Other | Admitting: Hematology and Oncology

## 2014-04-13 DIAGNOSIS — Z1211 Encounter for screening for malignant neoplasm of colon: Secondary | ICD-10-CM | POA: Diagnosis not present

## 2014-04-13 DIAGNOSIS — Z800 Family history of malignant neoplasm of digestive organs: Secondary | ICD-10-CM | POA: Diagnosis not present

## 2014-04-13 DIAGNOSIS — K5730 Diverticulosis of large intestine without perforation or abscess without bleeding: Secondary | ICD-10-CM | POA: Diagnosis not present

## 2014-04-18 DIAGNOSIS — Z01818 Encounter for other preprocedural examination: Secondary | ICD-10-CM | POA: Diagnosis not present

## 2014-04-18 DIAGNOSIS — H532 Diplopia: Secondary | ICD-10-CM | POA: Diagnosis not present

## 2014-04-18 DIAGNOSIS — I671 Cerebral aneurysm, nonruptured: Secondary | ICD-10-CM | POA: Diagnosis not present

## 2014-04-18 DIAGNOSIS — I770 Arteriovenous fistula, acquired: Secondary | ICD-10-CM | POA: Diagnosis not present

## 2014-04-18 DIAGNOSIS — T07 Unspecified multiple injuries: Secondary | ICD-10-CM | POA: Diagnosis not present

## 2014-04-20 DIAGNOSIS — I770 Arteriovenous fistula, acquired: Secondary | ICD-10-CM | POA: Diagnosis not present

## 2014-04-20 DIAGNOSIS — Z882 Allergy status to sulfonamides status: Secondary | ICD-10-CM | POA: Diagnosis not present

## 2014-04-20 DIAGNOSIS — Z90710 Acquired absence of both cervix and uterus: Secondary | ICD-10-CM | POA: Diagnosis not present

## 2014-05-05 DIAGNOSIS — Z23 Encounter for immunization: Secondary | ICD-10-CM | POA: Diagnosis not present

## 2014-06-07 ENCOUNTER — Other Ambulatory Visit: Payer: Medicare Other

## 2014-06-07 ENCOUNTER — Telehealth: Payer: Self-pay | Admitting: Oncology

## 2014-06-07 ENCOUNTER — Ambulatory Visit (HOSPITAL_BASED_OUTPATIENT_CLINIC_OR_DEPARTMENT_OTHER): Payer: Medicare Other | Admitting: Oncology

## 2014-06-07 VITALS — BP 146/80 | HR 66 | Temp 98.5°F | Resp 18 | Ht 66.0 in | Wt 185.0 lb

## 2014-06-07 DIAGNOSIS — E119 Type 2 diabetes mellitus without complications: Secondary | ICD-10-CM

## 2014-06-07 DIAGNOSIS — C773 Secondary and unspecified malignant neoplasm of axilla and upper limb lymph nodes: Secondary | ICD-10-CM

## 2014-06-07 DIAGNOSIS — E785 Hyperlipidemia, unspecified: Secondary | ICD-10-CM

## 2014-06-07 DIAGNOSIS — Z170 Estrogen receptor positive status [ER+]: Secondary | ICD-10-CM | POA: Diagnosis not present

## 2014-06-07 DIAGNOSIS — C50512 Malignant neoplasm of lower-outer quadrant of left female breast: Secondary | ICD-10-CM

## 2014-06-07 DIAGNOSIS — C50912 Malignant neoplasm of unspecified site of left female breast: Secondary | ICD-10-CM

## 2014-06-07 MED ORDER — DULOXETINE HCL 20 MG PO CPEP: 20.0000 mg | ORAL_CAPSULE | Freq: Every day | ORAL | Status: DC

## 2014-06-07 MED ORDER — TAMOXIFEN CITRATE 20 MG PO TABS: 20.0000 mg | ORAL_TABLET | Freq: Every day | ORAL | Status: DC
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
## 2014-06-07 NOTE — Telephone Encounter (Signed)
per pof to sch pt appt-gave pt copy of sch °

## 2014-06-08 DIAGNOSIS — E119 Type 2 diabetes mellitus without complications: Secondary | ICD-10-CM | POA: Insufficient documentation

## 2014-06-08 DIAGNOSIS — E785 Hyperlipidemia, unspecified: Secondary | ICD-10-CM | POA: Insufficient documentation

## 2014-06-08 DIAGNOSIS — C50912 Malignant neoplasm of unspecified site of left female breast: Secondary | ICD-10-CM | POA: Insufficient documentation

## 2014-06-24 DIAGNOSIS — E119 Type 2 diabetes mellitus without complications: Secondary | ICD-10-CM | POA: Diagnosis not present

## 2014-06-24 DIAGNOSIS — R8299 Other abnormal findings in urine: Secondary | ICD-10-CM | POA: Diagnosis not present

## 2014-06-24 DIAGNOSIS — Z008 Encounter for other general examination: Secondary | ICD-10-CM | POA: Diagnosis not present

## 2014-06-24 DIAGNOSIS — E785 Hyperlipidemia, unspecified: Secondary | ICD-10-CM | POA: Diagnosis not present

## 2014-06-24 DIAGNOSIS — I10 Essential (primary) hypertension: Secondary | ICD-10-CM | POA: Diagnosis not present

## 2014-06-24 DIAGNOSIS — M859 Disorder of bone density and structure, unspecified: Secondary | ICD-10-CM | POA: Diagnosis not present

## 2014-06-30 DIAGNOSIS — I10 Essential (primary) hypertension: Secondary | ICD-10-CM | POA: Diagnosis not present

## 2014-06-30 DIAGNOSIS — Z6828 Body mass index (BMI) 28.0-28.9, adult: Secondary | ICD-10-CM | POA: Diagnosis not present

## 2014-06-30 DIAGNOSIS — E785 Hyperlipidemia, unspecified: Secondary | ICD-10-CM | POA: Diagnosis not present

## 2014-06-30 DIAGNOSIS — M1990 Unspecified osteoarthritis, unspecified site: Secondary | ICD-10-CM | POA: Diagnosis not present

## 2014-06-30 DIAGNOSIS — M8580 Other specified disorders of bone density and structure, unspecified site: Secondary | ICD-10-CM | POA: Diagnosis not present

## 2014-06-30 DIAGNOSIS — H532 Diplopia: Secondary | ICD-10-CM | POA: Diagnosis not present

## 2014-06-30 DIAGNOSIS — Z1389 Encounter for screening for other disorder: Secondary | ICD-10-CM | POA: Diagnosis not present

## 2014-06-30 DIAGNOSIS — E669 Obesity, unspecified: Secondary | ICD-10-CM | POA: Diagnosis not present

## 2014-06-30 DIAGNOSIS — C50919 Malignant neoplasm of unspecified site of unspecified female breast: Secondary | ICD-10-CM | POA: Diagnosis not present

## 2014-06-30 DIAGNOSIS — E119 Type 2 diabetes mellitus without complications: Secondary | ICD-10-CM | POA: Diagnosis not present

## 2014-06-30 DIAGNOSIS — Z0000 Encounter for general adult medical examination without abnormal findings: Secondary | ICD-10-CM | POA: Diagnosis not present

## 2014-08-12 DIAGNOSIS — H2513 Age-related nuclear cataract, bilateral: Secondary | ICD-10-CM | POA: Diagnosis not present

## 2014-08-12 DIAGNOSIS — H40051 Ocular hypertension, right eye: Secondary | ICD-10-CM | POA: Diagnosis not present

## 2014-08-12 DIAGNOSIS — H4923 Sixth [abducent] nerve palsy, bilateral: Secondary | ICD-10-CM | POA: Diagnosis not present

## 2014-08-12 DIAGNOSIS — H532 Diplopia: Secondary | ICD-10-CM | POA: Diagnosis not present

## 2014-09-15 ENCOUNTER — Telehealth: Payer: Self-pay | Admitting: Oncology

## 2014-09-15 NOTE — Telephone Encounter (Signed)
pt cld left vm wanting to r/s appt  a week prior to appt already sch-cld pt back & left a message to adv no appts avail @ that time to call us back to r/s

## 2014-11-01 ENCOUNTER — Other Ambulatory Visit: Payer: Medicare Other

## 2014-11-08 ENCOUNTER — Ambulatory Visit: Payer: Medicare Other | Admitting: Oncology

## 2014-11-10 ENCOUNTER — Other Ambulatory Visit: Payer: Self-pay | Admitting: *Deleted

## 2014-11-10 DIAGNOSIS — C50912 Malignant neoplasm of unspecified site of left female breast: Secondary | ICD-10-CM

## 2014-11-15 ENCOUNTER — Other Ambulatory Visit (HOSPITAL_BASED_OUTPATIENT_CLINIC_OR_DEPARTMENT_OTHER): Payer: Medicare Other

## 2014-11-15 DIAGNOSIS — C773 Secondary and unspecified malignant neoplasm of axilla and upper limb lymph nodes: Secondary | ICD-10-CM

## 2014-11-15 DIAGNOSIS — C50912 Malignant neoplasm of unspecified site of left female breast: Secondary | ICD-10-CM

## 2014-11-15 DIAGNOSIS — C50512 Malignant neoplasm of lower-outer quadrant of left female breast: Secondary | ICD-10-CM

## 2014-11-15 LAB — COMPREHENSIVE METABOLIC PANEL (CC13)
ALBUMIN: 4 g/dL (ref 3.5–5.0)
ALK PHOS: 58 U/L (ref 40–150)
ALT: 13 U/L (ref 0–55)
AST: 19 U/L (ref 5–34)
Anion Gap: 8 mEq/L (ref 3–11)
BUN: 13.4 mg/dL (ref 7.0–26.0)
CALCIUM: 9.6 mg/dL (ref 8.4–10.4)
CHLORIDE: 111 meq/L — AB (ref 98–109)
CO2: 24 mEq/L (ref 22–29)
Creatinine: 0.7 mg/dL (ref 0.6–1.1)
EGFR: 88 mL/min/{1.73_m2} — ABNORMAL LOW (ref >90)
Glucose: 99 mg/dl (ref 70–140)
POTASSIUM: 4.8 meq/L (ref 3.5–5.1)
Sodium: 143 mEq/L (ref 136–145)
Total Bilirubin: 0.71 mg/dL (ref 0.20–1.20)
Total Protein: 6.5 g/dL (ref 6.4–8.3)

## 2014-11-15 LAB — CBC WITH DIFFERENTIAL/PLATELET
BASO%: 0.7 % (ref 0.0–2.0)
BASOS ABS: 0 10*3/uL (ref 0.0–0.1)
EOS%: 0.9 % (ref 0.0–7.0)
Eosinophils Absolute: 0 10*3/uL (ref 0.0–0.5)
HEMATOCRIT: 39.1 % (ref 34.8–46.6)
HEMOGLOBIN: 12.8 g/dL (ref 11.6–15.9)
LYMPH%: 39.1 % (ref 14.0–49.7)
MCH: 30.5 pg (ref 25.1–34.0)
MCHC: 32.6 g/dL (ref 31.5–36.0)
MCV: 93.6 fL (ref 79.5–101.0)
MONO#: 0.4 10*3/uL (ref 0.1–0.9)
MONO%: 8.2 % (ref 0.0–14.0)
NEUT#: 2.8 10*3/uL (ref 1.5–6.5)
NEUT%: 51.1 % (ref 38.4–76.8)
Platelets: 213 10*3/uL (ref 145–400)
RBC: 4.18 10*6/uL (ref 3.70–5.45)
RDW: 13.9 % (ref 11.2–14.5)
WBC: 5.4 10*3/uL (ref 3.9–10.3)
lymph#: 2.1 10*3/uL (ref 0.9–3.3)

## 2014-11-22 ENCOUNTER — Ambulatory Visit (HOSPITAL_BASED_OUTPATIENT_CLINIC_OR_DEPARTMENT_OTHER): Payer: Medicare Other | Admitting: Oncology

## 2014-11-22 ENCOUNTER — Telehealth: Payer: Self-pay | Admitting: Oncology

## 2014-11-22 VITALS — BP 126/72 | HR 75 | Temp 98.3°F | Resp 18 | Ht 66.0 in | Wt 183.5 lb

## 2014-11-22 DIAGNOSIS — C773 Secondary and unspecified malignant neoplasm of axilla and upper limb lymph nodes: Secondary | ICD-10-CM | POA: Diagnosis not present

## 2014-11-22 DIAGNOSIS — C50512 Malignant neoplasm of lower-outer quadrant of left female breast: Secondary | ICD-10-CM

## 2014-11-22 DIAGNOSIS — M8580 Other specified disorders of bone density and structure, unspecified site: Secondary | ICD-10-CM

## 2014-11-22 DIAGNOSIS — Z800 Family history of malignant neoplasm of digestive organs: Secondary | ICD-10-CM

## 2014-11-22 MED ORDER — TAMOXIFEN CITRATE 20 MG PO TABS: 20.0000 mg | ORAL_TABLET | Freq: Every day | ORAL | Status: DC

## 2014-11-22 MED ORDER — KETOCONAZOLE 2 % EX CREA: 1.0000 "application " | TOPICAL_CREAM | Freq: Every day | CUTANEOUS | Status: DC

## 2014-11-22 NOTE — Telephone Encounter (Signed)
Gave avs & calendar for October. °

## 2014-11-22 NOTE — Progress Notes (Signed)
[facility]  Telephone:(336) [phone] Fax:(336) [phone]     ID: KAGAN HIETPAS DOB: [birth year]  MR#: [id number]  GGY#:[id number]  Patient Care Team: Shon Baton, MD as PCP - General (Internal Medicine) Thea Silversmith, MD as Consulting Physician (Radiation Oncology) Chauncey Cruel, MD as Consulting Physician (Oncology) OTHER MD: Crissie Reese M.D., Jamie Kato MD  CHIEF COMPLAINT: Estrogen receptor positive breast cancer  CURRENT TREATMENT: Tamoxifen   BREAST CANCER HISTORY: From Dr.Kalsoom Khan's 09/12/2010 intake note:  "Patient is a very pleasant 69 year old female from [place], [place] with medical history significant for hyperlipidemia, hypertension, type 2 diabetes, and chronic nonspecific rash.  Patient's diabetes is diet controlled.  She is on Crestor for her hyperlipidemia.  Patient began having screening mammograms at the age of 54.  On 08/23/2010, she presented for a screening mammogram that showed possible abnormality in the left breast superiorly.  There was a question of breast architectural distortion.  Additional imaging was recommended.  The right breast demonstrated what appear to be asymmetric fibroglandular structures in the axillary tail of the right breast.  Patient went onto have unilateral diagnostic mammogram of the left breast.  This showed small stellate nodule measuring up to 1.2 cm in diameter.  Left breast ultrasound demonstrated 6.0 mm in diameter solid nodule with dense posterior shadowing corresponding to what was seen mammographically.  Ultrasound-guided needle core biopsy was discussed and she went onto have this performed on 09/06/2010.  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  She then went onto have MRI of the breasts performed, the results of which are not available to me in the written format, but I  reviewed the MRI imaging at the Multidisciplinary Breast Conference this morning.  There was noted to be several suspicious areas.  The first primary area measured about 1.5 cm.  The second area measured about 2.7 cm, and the third area just posteriorly was 6.0 mm.  The second and the third area on the MRI are scheduled to be biopsied in the next few days."  Her subsequent history is detailed below.  INTERVAL HISTORY: Don returns today for follow-up of her estrogen receptor positive breast cancer. She is doing "great". She went off aromatase inhibitors in December, and she gradually noted resolution of her many arthralgias and myalgias and also more energy. She also went off the Cymbalta. She took that for about 3 years after her accident and she feels it did help her get through the difficult period. On the other hand she had "no feelings". After being off Cymbalta for about 2 months she was able to laugh, chronic, and just basically having normal feelings. She started tamoxifen in February. She is tolerating it with minimal and rare hot flashes as the only side effect. She obtains it at a good price.  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 She is having no unusual headaches, nausea, vomiting, dizziness, or gait imbalance. She denies cough, phlegm production or pleurisy. She is up-to-date on colonoscopies (with a history of colon cancer in the family). Her blood sugars are well-controlled. A detailed review of systems today was otherwise stable  PAST MEDICAL HISTORY: Past Medical History  Diagnosis Date  . Pain   . Hyperlipidemia     controlled  . Diabetes mellitus   . Breast cancer   . 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 Breast surgery      mastectomy -lt  . Ganglion cyst excision    . Tubal ligation      FAMILY HISTORY Family History    Problem Relation Age of Onset  . Heart disease Father     heart attack   the patient's father died at the age of 8 from a myocardial infarction. The patient's mother died at the age of 43 with dementia. The patient is an only child.  GYNECOLOGIC HISTORY:  No LMP recorded. Patient is postmenopausal. menarche age 22, first live birth age 30. She is GX P3. She went through the change of life in her early 76s, and was on hormone replacement for more than 10 years.   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 Her husband [name] of course is a Occupational hygienist. Daughter [name] in fine arts degree and works as a Buyer, retail. Son [name] is an Recruitment consultant in [place]. Son [name] studied at [employer] but now works in a semi-independent living in [place] and works at [employer]. The patient has 3 grandchildren. She at tends [place]: In place. The patient's husband is her healthcare power of attorney   HEALTH MAINTENANCE: History  Substance Use Topics  . Smoking status: Never Smoker   . Smokeless tobacco: Not on file  . Alcohol Use: No     Colonoscopy:  PAP:  Bone density: 12/29/2012 showed osteopenia  Lipid panel:  Allergies  Allergen Reactions  . Sulfa Antibiotics Rash    Current Outpatient Prescriptions  Medication Sig Dispense Refill  . CRESTOR 10 MG tablet 10 mg daily.     Marland Kitchen ibuprofen (ADVIL,MOTRIN) 800 MG tablet Take 800 mg by mouth 2 (two) times daily.    Marland Kitchen ketoconazole (NIZORAL) 2 % cream Apply 1 application topically daily. 15 g 0  . metFORMIN (GLUCOPHAGE) 1000 MG tablet Take 1,000 mg by mouth daily.     . tamoxifen (NOLVADEX) 20 MG tablet Take 1 tablet (20 mg total) by mouth daily. 90 tablet 4  . VOLTAREN 1 % GEL      No current facility-administered medications for this visit.    OBJECTIVE: Middle-aged white woman who appears well Filed Vitals:   11/22/14 1209  BP: 126/72   Pulse: 75  Temp: 98.3 F (36.8 C)  Resp: 18     Body mass index is 29.63 kg/(m^2).    ECOG FS:0 - Asymptomatic  Sclerae unicteric, pupils round and equal Oropharynx clear and moist-- no thrush or other lesions No cervical or supraclavicular adenopathy Lungs no rales or rhonchi Heart regular rate and rhythm Abd soft, nontender, positive bowel sounds MSK no focal spinal tenderness, no upper extremity lymphedema Neuro: nonfocal, well oriented, appropriate affect Breasts: The right breast is unremarkable. The left breast is status post mastectomy with reconstruction. There is no evidence of local recurrence. The left axilla is benign.   LAB RESULTS:  CMP     Component Value Date/Time   NA 143 11/15/2014 1352   NA 142 10/16/2011 1308   K 4.8 11/15/2014 1352   K 4.6 10/16/2011 1308   CL 108* 07/02/2012 1001   CL 109 10/16/2011 1308   CO2 24 11/15/2014 1352   CO2 28 10/16/2011 1308   GLUCOSE 99 11/15/2014 1352   GLUCOSE 110* 07/02/2012 1001   GLUCOSE 106* 10/16/2011  1308   BUN 13.4 11/15/2014 1352   BUN 23 10/16/2011 1308   CREATININE 0.7 11/15/2014 1352   CREATININE 0.64 10/16/2011 1308   CALCIUM 9.6 11/15/2014 1352   CALCIUM 11.0* 10/16/2011 1308   PROT 6.5 11/15/2014 1352   PROT 6.7 10/16/2011 1308   ALBUMIN 4.0 11/15/2014 1352   ALBUMIN 4.8 10/16/2011 1308   AST 19 11/15/2014 1352   AST 24 10/16/2011 1308   ALT 13 11/15/2014 1352   ALT 19 10/16/2011 1308   ALKPHOS 58 11/15/2014 1352   ALKPHOS 64 10/16/2011 1308   BILITOT 0.71 11/15/2014 1352   BILITOT 0.8 10/16/2011 1308   GFRNONAA >90 04/08/2011 1100   GFRAA >90 04/08/2011 1100    INo results found for: SPEP, UPEP  Lab Results  Component Value Date   WBC 5.4 11/15/2014   NEUTROABS 2.8 11/15/2014   HGB 12.8 11/15/2014   HCT 39.1 11/15/2014   MCV 93.6 11/15/2014   PLT 213 11/15/2014      Chemistry      Component Value Date/Time   NA 143 11/15/2014 1352   NA 142 10/16/2011 1308   K 4.8 11/15/2014  1352   K 4.6 10/16/2011 1308   CL 108* 07/02/2012 1001   CL 109 10/16/2011 1308   CO2 24 11/15/2014 1352   CO2 28 10/16/2011 1308   BUN 13.4 11/15/2014 1352   BUN 23 10/16/2011 1308   CREATININE 0.7 11/15/2014 1352   CREATININE 0.64 10/16/2011 1308      Component Value Date/Time   CALCIUM 9.6 11/15/2014 1352   CALCIUM 11.0* 10/16/2011 1308   ALKPHOS 58 11/15/2014 1352   ALKPHOS 64 10/16/2011 1308   AST 19 11/15/2014 1352   AST 24 10/16/2011 1308   ALT 13 11/15/2014 1352   ALT 19 10/16/2011 1308   BILITOT 0.71 11/15/2014 1352   BILITOT 0.8 10/16/2011 1308       Lab Results  Component Value Date   LABCA2 14 09/12/2010    No components found for: [id number]  No results for input(s): INR in the last 168 hours.  Urinalysis No results found for: COLORURINE, APPEARANCEUR, LABSPEC, PHURINE, GLUCOSEU, HGBUR, BILIRUBINUR, KETONESUR, PROTEINUR, UROBILINOGEN, NITRITE, LEUKOCYTESUR  STUDIES: CLINICAL DATA: Screening.  EXAM: DIGITAL SCREENING UNILATERAL RIGHT MAMMOGRAM WITH TOMO AND CAD  COMPARISON: Previous exam(s)  ACR Breast Density Category c: The breast tissue is heterogeneously dense, which may obscure small masses.  FINDINGS: The patient has had a left mastectomy. There are no findings suspicious for malignancy. Images were processed with CAD.  IMPRESSION: No mammographic evidence of malignancy. A result letter of this screening mammogram will be mailed directly to the patient.  RECOMMENDATION: Screening mammogram in one year. (Code:SM-B-01Y)  BI-RADS CATEGORY 1: Negative.   Electronically Signed  By: Shon Hale M.D.  On: 03/11/2014 14:58   ASSESSMENT: 69 y.o. [place] woman status post left breast biopsy 09/06/2010 for a clinically multifocal T2 NX invasive ductal carcinoma, low-grade, estrogen receptor 96% positive, progesterone receptor 91% positive, with an MIB-1 of 17%, and no HER-2 amplification, the signals ratio being 1.11(SAA  [id number])  (1) additional biopsies from the left breast lower outer quadrant 09/20/2010 showed again low-grade invasive ductal carcinoma. The prognostic panel was estrogen receptor positive at 97%, progesterone receptor positive at 82%, with an MIB-1 of 10% and no HER-2 amplification, the signals ratio being 0.90. (SAA [id number])  (2) Oncotype DX obtained from the 09/20/2010 surgery showed a recurrent score of 11 predicting (in node-negative patients) a risk of outside the  breast recurrence of 8% if her only systemic treatment is tamoxifen for 5 years. In this group of patients, this recurrent score also predicts no benefit from chemotherapy  (3) status post left mastectomy with axillary lymph node dissection (a total of 15 lymph nodes removed) 12/24/2010 for an mpT1c pN1a, stage IIA invasive ductal carcinoma, grade 1, with ample margins and immediate expander placement  (a) saline implant placement and right mastopexy performed 04/10/2011  (4). The patient's case was presented at the multidisciplinary breast cancer conference and letrozole was recommended (referred to Dr. Laurelyn Sickle note from 01/08/2011). The patient did not receive chemotherapy or postmastectomy radiation.  (5) letrozole was started October 2012 (though Dr. Laurelyn Sickle notes clearly state letrozole was started before the definitive surgery, the patient does not recall taking this medication until the fall of 2012). Letrozole was stopped 06/07/2014  (6) tamoxifen started February 2016  PLAN: Mckaylin is very happy with her current situation, and is tolerating tamoxifen well. Normally we would have considered stopping antiestrogen therapy after 5 years, but given the fact that she was node positive and did not receive chemotherapy, I would feel better obtaining the maximum benefit from antiestrogen son that means a total of 10 years.  Where going to see her again in October and after that we will see her on a yearly basis until the 10 years are  up. We will continue on tamoxifen during that time unless any problems develop associated with it.  Nakiya knows to call for any problems that may develop before her next visit here. Chauncey Cruel, MD   11/22/2014 2:22 PM Medical Oncology and Hematology [facility] [address] [place],  [address] Tel. [phone]    Fax. [phone]

## 2015-01-09 DIAGNOSIS — E669 Obesity, unspecified: Secondary | ICD-10-CM | POA: Diagnosis not present

## 2015-01-09 DIAGNOSIS — E119 Type 2 diabetes mellitus without complications: Secondary | ICD-10-CM | POA: Diagnosis not present

## 2015-01-09 DIAGNOSIS — H1130 Conjunctival hemorrhage, unspecified eye: Secondary | ICD-10-CM | POA: Diagnosis not present

## 2015-01-09 DIAGNOSIS — M8580 Other specified disorders of bone density and structure, unspecified site: Secondary | ICD-10-CM | POA: Diagnosis not present

## 2015-01-09 DIAGNOSIS — I10 Essential (primary) hypertension: Secondary | ICD-10-CM | POA: Diagnosis not present

## 2015-01-09 DIAGNOSIS — E785 Hyperlipidemia, unspecified: Secondary | ICD-10-CM | POA: Diagnosis not present

## 2015-01-09 DIAGNOSIS — Z6828 Body mass index (BMI) 28.0-28.9, adult: Secondary | ICD-10-CM | POA: Diagnosis not present

## 2015-02-09 DIAGNOSIS — H02401 Unspecified ptosis of right eyelid: Secondary | ICD-10-CM | POA: Diagnosis not present

## 2015-02-09 DIAGNOSIS — H40051 Ocular hypertension, right eye: Secondary | ICD-10-CM | POA: Diagnosis not present

## 2015-02-09 DIAGNOSIS — H05241 Constant exophthalmos, right eye: Secondary | ICD-10-CM | POA: Diagnosis not present

## 2015-02-09 DIAGNOSIS — H2513 Age-related nuclear cataract, bilateral: Secondary | ICD-10-CM | POA: Diagnosis not present

## 2015-02-14 ENCOUNTER — Other Ambulatory Visit: Payer: Self-pay

## 2015-02-14 DIAGNOSIS — Z9012 Acquired absence of left breast and nipple: Secondary | ICD-10-CM

## 2015-02-14 DIAGNOSIS — Z1231 Encounter for screening mammogram for malignant neoplasm of breast: Secondary | ICD-10-CM

## 2015-03-04 DIAGNOSIS — Z23 Encounter for immunization: Secondary | ICD-10-CM | POA: Diagnosis not present

## 2015-03-16 ENCOUNTER — Ambulatory Visit: Payer: Medicare Other

## 2015-03-17 ENCOUNTER — Ambulatory Visit
Admission: RE | Admit: 2015-03-17 | Discharge: 2015-03-17 | Disposition: A | Discharge status: Home / Self Care | Payer: Medicare Other | Source: Ambulatory Visit

## 2015-03-17 DIAGNOSIS — Z1231 Encounter for screening mammogram for malignant neoplasm of breast: Secondary | ICD-10-CM

## 2015-03-17 DIAGNOSIS — R921 Mammographic calcification found on diagnostic imaging of breast: Secondary | ICD-10-CM | POA: Diagnosis not present

## 2015-03-17 DIAGNOSIS — Z9012 Acquired absence of left breast and nipple: Secondary | ICD-10-CM

## 2015-03-20 ENCOUNTER — Other Ambulatory Visit: Payer: Self-pay | Admitting: Internal Medicine

## 2015-03-20 DIAGNOSIS — R928 Other abnormal and inconclusive findings on diagnostic imaging of breast: Secondary | ICD-10-CM

## 2015-04-10 ENCOUNTER — Other Ambulatory Visit: Payer: Self-pay | Admitting: *Deleted

## 2015-04-10 ENCOUNTER — Ambulatory Visit
Admission: RE | Admit: 2015-04-10 | Discharge: 2015-04-10 | Disposition: A | Discharge status: Home / Self Care | Payer: Medicare Other | Source: Ambulatory Visit | Attending: Internal Medicine | Admitting: Internal Medicine

## 2015-04-10 DIAGNOSIS — C50912 Malignant neoplasm of unspecified site of left female breast: Secondary | ICD-10-CM

## 2015-04-10 DIAGNOSIS — R928 Other abnormal and inconclusive findings on diagnostic imaging of breast: Secondary | ICD-10-CM | POA: Diagnosis not present

## 2015-04-11 ENCOUNTER — Other Ambulatory Visit (HOSPITAL_BASED_OUTPATIENT_CLINIC_OR_DEPARTMENT_OTHER): Payer: Medicare Other

## 2015-04-11 DIAGNOSIS — C50912 Malignant neoplasm of unspecified site of left female breast: Secondary | ICD-10-CM

## 2015-04-11 DIAGNOSIS — C50512 Malignant neoplasm of lower-outer quadrant of left female breast: Secondary | ICD-10-CM | POA: Diagnosis present

## 2015-04-11 DIAGNOSIS — C773 Secondary and unspecified malignant neoplasm of axilla and upper limb lymph nodes: Secondary | ICD-10-CM | POA: Diagnosis not present

## 2015-04-11 LAB — CBC WITH DIFFERENTIAL/PLATELET
BASO%: 0.8 % (ref 0.0–2.0)
Basophils Absolute: 0 10*3/uL (ref 0.0–0.1)
EOS%: 1.5 % (ref 0.0–7.0)
Eosinophils Absolute: 0.1 10*3/uL (ref 0.0–0.5)
HCT: 38.8 % (ref 34.8–46.6)
HGB: 12.9 g/dL (ref 11.6–15.9)
LYMPH%: 33.8 % (ref 14.0–49.7)
MCH: 30.9 pg (ref 25.1–34.0)
MCHC: 33.2 g/dL (ref 31.5–36.0)
MCV: 93.2 fL (ref 79.5–101.0)
MONO#: 0.5 10*3/uL (ref 0.1–0.9)
MONO%: 9.4 % (ref 0.0–14.0)
NEUT%: 54.5 % (ref 38.4–76.8)
NEUTROS ABS: 2.9 10*3/uL (ref 1.5–6.5)
PLATELETS: 223 10*3/uL (ref 145–400)
RBC: 4.16 10*6/uL (ref 3.70–5.45)
RDW: 13.5 % (ref 11.2–14.5)
WBC: 5.4 10*3/uL (ref 3.9–10.3)
lymph#: 1.8 10*3/uL (ref 0.9–3.3)

## 2015-04-11 LAB — COMPREHENSIVE METABOLIC PANEL (CC13)
ALBUMIN: 4.1 g/dL (ref 3.5–5.0)
ALK PHOS: 51 U/L (ref 40–150)
ALT: 18 U/L (ref 0–55)
AST: 18 U/L (ref 5–34)
Anion Gap: 5 mEq/L (ref 3–11)
BILIRUBIN TOTAL: 0.68 mg/dL (ref 0.20–1.20)
BUN: 15.5 mg/dL (ref 7.0–26.0)
CO2: 26 mEq/L (ref 22–29)
Calcium: 10.3 mg/dL (ref 8.4–10.4)
Chloride: 110 mEq/L — ABNORMAL HIGH (ref 98–109)
Creatinine: 0.7 mg/dL (ref 0.6–1.1)
EGFR: 88 mL/min/{1.73_m2} — ABNORMAL LOW (ref >90)
GLUCOSE: 96 mg/dL (ref 70–140)
Potassium: 4.4 mEq/L (ref 3.5–5.1)
SODIUM: 141 meq/L (ref 136–145)
TOTAL PROTEIN: 6.7 g/dL (ref 6.4–8.3)

## 2015-04-17 ENCOUNTER — Telehealth: Payer: Self-pay | Admitting: Oncology

## 2015-04-17 ENCOUNTER — Ambulatory Visit (HOSPITAL_BASED_OUTPATIENT_CLINIC_OR_DEPARTMENT_OTHER): Payer: Medicare Other | Admitting: Oncology

## 2015-04-17 VITALS — BP 133/82 | HR 67 | Temp 98.2°F | Resp 18 | Ht 66.0 in | Wt 183.9 lb

## 2015-04-17 DIAGNOSIS — C773 Secondary and unspecified malignant neoplasm of axilla and upper limb lymph nodes: Secondary | ICD-10-CM | POA: Diagnosis not present

## 2015-04-17 DIAGNOSIS — C50929 Malignant neoplasm of unspecified site of unspecified male breast: Secondary | ICD-10-CM

## 2015-04-17 DIAGNOSIS — E119 Type 2 diabetes mellitus without complications: Secondary | ICD-10-CM | POA: Diagnosis not present

## 2015-04-17 DIAGNOSIS — C50512 Malignant neoplasm of lower-outer quadrant of left female breast: Secondary | ICD-10-CM | POA: Diagnosis not present

## 2015-04-17 DIAGNOSIS — M8580 Other specified disorders of bone density and structure, unspecified site: Secondary | ICD-10-CM | POA: Diagnosis not present

## 2015-04-17 NOTE — Telephone Encounter (Signed)
appointments made and avs printed for patient °

## 2015-04-17 NOTE — Progress Notes (Signed)
[facility]  Telephone:(336) [phone] Fax:(336) [phone]     ID: Jennifer Rivera DOB: May 12, 1946  MR#: [id number]  JYN#:[id number]  Patient Care Team: Shon Baton, MD as PCP - General (Internal Medicine) Thea Silversmith, MD as Consulting Physician (Radiation Oncology) Chauncey Cruel, MD as Consulting Physician (Oncology) OTHER MD: Crissie Reese M.D., Jamie Kato MD  CHIEF COMPLAINT: Estrogen receptor positive breast cancer  CURRENT TREATMENT: Tamoxifen   BREAST CANCER HISTORY: From Dr.Kalsoom Rivera's 09/12/2010 intake note:  "Patient is a very pleasant 69 year old female from [place], [place] with medical history significant for hyperlipidemia, hypertension, type 2 diabetes, and chronic nonspecific rash.  Patient's diabetes is diet controlled.  She is on Crestor for her hyperlipidemia.  Patient began having screening mammograms at the age of 79.  On 08/23/2010, she presented for a screening mammogram that showed possible abnormality in the left breast superiorly.  There was a question of breast architectural distortion.  Additional imaging was recommended.  The right breast demonstrated what appear to be asymmetric fibroglandular structures in the axillary tail of the right breast.  Patient went onto have unilateral diagnostic mammogram of the left breast.  This showed small stellate nodule measuring up to 1.2 cm in diameter.  Left breast ultrasound demonstrated 6.0 mm in diameter solid nodule with dense posterior shadowing corresponding to what was seen mammographically.  Ultrasound-guided needle core biopsy was discussed and she went onto have this performed on 09/06/2010.  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  She then went onto have MRI of the breasts performed, the results of which are not available to me in the written format, but I  reviewed the MRI imaging at the Multidisciplinary Breast Conference this morning.  There was noted to be several suspicious areas.  The first primary area measured about 1.5 cm.  The second area measured about 2.7 cm, and the third area just posteriorly was 6.0 mm.  The second and the third area on the MRI are scheduled to be biopsied in the next few days."  Her subsequent history is detailed below.  INTERVAL HISTORY: Jennifer Rivera today for follow-up of her estrogen receptor positive breast cancer.  She continues on tamoxifen, which she obtains had a very good price. She does not have hot flashes or really any other side effects related to this.  REVIEW OF SYSTEMS:  She likes to walk about 4 miles a day but recently has been having tendinitis in the right foot and has not been able to do this she is hopeful to be able to build up to it again over the next month. Her blood sugars are well-controlled. Aside from these issues a detailed review of systems today was benign.  PAST MEDICAL HISTORY: Past Medical History  Diagnosis Date  . Pain   . Hyperlipidemia     controlled  . Diabetes mellitus   . Breast cancer   . 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 Breast surgery      mastectomy -lt  . Ganglion cyst excision    . Tubal ligation      FAMILY HISTORY Family History  Problem Relation Age of Onset  . Heart disease Father     heart attack   the patient's father died at the age of 39 from a myocardial infarction. The patient's mother died  at the age of 64 with dementia. The patient is an only child.  GYNECOLOGIC HISTORY:  No LMP recorded. Patient is postmenopausal. menarche age 1, first live birth age 76. She is GX P3. She went through the change of life in her early 75s, and was on hormone replacement for more than 10 years.   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 Her husband Jennifer Rivera of course is a Occupational hygienist. Daughter Jennifer Rivera Jennifer Rivera Masters in fine arts degree and works as a Buyer, retail. Son Jennifer Rivera is an Recruitment consultant in [place]. Son Jennifer Rivera studied at [employer] but now works in a semi-independent living in [place] and works at [employer]. The patient has 3 grandchildren. She at tends [place]: In place. The patient's husband is her healthcare power of attorney   HEALTH MAINTENANCE: Social History  Substance Use Topics  . Smoking status: Never Smoker   . Smokeless tobacco: Not on file  . Alcohol Use: No     Colonoscopy:  PAP:  Bone density: 12/29/2012 showed osteopenia  Lipid panel:  Allergies  Allergen Reactions  . Sulfa Antibiotics Rash    Current Outpatient Prescriptions  Medication Sig Dispense Refill  . CRESTOR 10 MG tablet 10 mg daily.     Marland Kitchen ibuprofen (ADVIL,MOTRIN) 800 MG tablet Take 800 mg by mouth 2 (two) times daily.    Marland Kitchen ketoconazole (NIZORAL) 2 % cream Apply 1 application topically daily. 15 g 0  . metFORMIN (GLUCOPHAGE) 1000 MG tablet Take 1,000 mg by mouth daily.     . tamoxifen (NOLVADEX) 20 MG tablet Take 1 tablet (20 mg total) by mouth daily. 90 tablet 4  . VOLTAREN 1 % GEL      No current facility-administered medications for this visit.    OBJECTIVE: Middle-aged white woman in no acute distress Filed Vitals:   04/17/15 1056  BP: 133/82  Pulse: 67  Temp: 98.2 F (36.8 C)  Resp: 18     Body mass index is 29.7 kg/(m^2).    ECOG FS:1 - Symptomatic but completely ambulatory  Sclerae unicteric, EOMs intact Oropharynx clear, dentition in good repair No cervical or supraclavicular adenopathy Lungs no rales or rhonchi Heart regular rate and rhythm Abd soft, nontender, positive bowel sounds MSK no focal spinal tenderness, no upper extremity lymphedema Neuro: nonfocal, well oriented, appropriate affect Breasts:  The right breast is unremarkable. The left  breast is status post mastectomy and reconstruction.. There is no evidence of local recurrence. The left axilla is benign.   LAB RESULTS:  CMP     Component Value Date/Time   NA 141 04/11/2015 1054   NA 142 10/16/2011 1308   K 4.4 04/11/2015 1054   K 4.6 10/16/2011 1308   CL 108* 07/02/2012 1001   CL 109 10/16/2011 1308   CO2 26 04/11/2015 1054   CO2 28 10/16/2011 1308   GLUCOSE 96 04/11/2015 1054   GLUCOSE 110* 07/02/2012 1001   GLUCOSE 106* 10/16/2011 1308   BUN 15.5 04/11/2015 1054   BUN 23 10/16/2011 1308   CREATININE 0.7 04/11/2015 1054   CREATININE 0.64 10/16/2011 1308   CALCIUM 10.3 04/11/2015 1054   CALCIUM 11.0* 10/16/2011 1308   PROT 6.7 04/11/2015 1054   PROT 6.7 10/16/2011 1308   ALBUMIN 4.1 04/11/2015 1054   ALBUMIN 4.8 10/16/2011 1308   AST 18 04/11/2015 1054   AST 24 10/16/2011 1308   ALT 18  04/11/2015 1054   ALT 19 10/16/2011 1308   ALKPHOS 51 04/11/2015 1054   ALKPHOS 64 10/16/2011 1308   BILITOT 0.68 04/11/2015 1054   BILITOT 0.8 10/16/2011 1308   GFRNONAA >90 04/08/2011 1100   GFRAA >90 04/08/2011 1100    INo results found for: SPEP, UPEP  Lab Results  Component Value Date   WBC 5.4 04/11/2015   NEUTROABS 2.9 04/11/2015   HGB 12.9 04/11/2015   HCT 38.8 04/11/2015   MCV 93.2 04/11/2015   PLT 223 04/11/2015      Chemistry      Component Value Date/Time   NA 141 04/11/2015 1054   NA 142 10/16/2011 1308   K 4.4 04/11/2015 1054   K 4.6 10/16/2011 1308   CL 108* 07/02/2012 1001   CL 109 10/16/2011 1308   CO2 26 04/11/2015 1054   CO2 28 10/16/2011 1308   BUN 15.5 04/11/2015 1054   BUN 23 10/16/2011 1308   CREATININE 0.7 04/11/2015 1054   CREATININE 0.64 10/16/2011 1308      Component Value Date/Time   CALCIUM 10.3 04/11/2015 1054   CALCIUM 11.0* 10/16/2011 1308   ALKPHOS 51 04/11/2015 1054   ALKPHOS 64 10/16/2011 1308   AST 18 04/11/2015 1054   AST 24 10/16/2011 1308   ALT 18 04/11/2015 1054   ALT 19 10/16/2011 1308   BILITOT  0.68 04/11/2015 1054   BILITOT 0.8 10/16/2011 1308       Lab Results  Component Value Date   LABCA2 14 09/12/2010    No components found for: [id number]  No results for input(s): INR in the last 168 hours.  Urinalysis No results found for: COLORURINE, APPEARANCEUR, LABSPEC, [place], GLUCOSEU, HGBUR, BILIRUBINUR, KETONESUR, PROTEINUR, UROBILINOGEN, NITRITE, LEUKOCYTESUR  STUDIES: Mm Digital Diagnostic Unilat R  04/10/2015  CLINICAL DATA:  Calcifications right breast identified on recent screening mammogram. The patient has a history of right breast reduction in October 2012. History of left breast cancer, status post left mastectomy. EXAM: DIGITAL DIAGNOSTIC RIGHT MAMMOGRAM COMPARISON:  03/17/2015 ACR Breast Density Category b: There are scattered areas of fibroglandular density. FINDINGS: Magnification views of the upper central right breast, anterior third, demonstrate a round lucent oval mass, best seen in the ML projection, with associated coarse, sheet-like calcifications and 2 additional rounded coarse calcifications. These are consistent with benign fat necrosis, likely related to breast reduction. No suspicious microcalcifications identified. IMPRESSION: Benign calcifications associated with an oil cyst, consistent with benign fat necrosis. No evidence of malignancy. RECOMMENDATION: Screening mammogram in one year.(Code:SM-B-01Y) I have discussed the findings and recommendations with the patient. Results were also provided in writing at the conclusion of the visit. If applicable, a reminder letter will be sent to the patient regarding the next appointment. BI-RADS CATEGORY  2: Benign. Electronically Signed   By: Curlene Dolphin M.D.   On: 04/10/2015 12:09      ASSESSMENT: 69 y.o. [place] woman status post left breast biopsy 09/06/2010 for a clinically multifocal T2 NX invasive ductal carcinoma, low-grade, estrogen receptor 96% positive, progesterone receptor 91% positive, with an  MIB-1 of 17%, and no HER-2 amplification, the signals ratio being 1.11([id number])  (1) additional biopsies from the left breast lower outer quadrant 09/20/2010 showed again low-grade invasive ductal carcinoma. The prognostic panel was estrogen receptor positive at 97%, progesterone receptor positive at 82%, with an MIB-1 of 10% and no HER-2 amplification, the signals ratio being 0.90. ([id number] [id number])  (2) Oncotype DX obtained from the 09/20/2010 surgery showed a recurrent  score of 11 predicting (in node-negative patients) a risk of outside the breast recurrence of 8% if her only systemic treatment is tamoxifen for 5 years. In this group of patients, this recurrent score also predicts no benefit from chemotherapy  (3) status post left mastectomy with axillary lymph node dissection (a total of 15 lymph nodes removed) 12/24/2010 for an mpT1c pN1a, stage IIA invasive ductal carcinoma, grade 1, with ample margins and immediate expander placement  (a) saline implant placement and right mastopexy performed 04/10/2011  (4). The patient's case was presented at the multidisciplinary breast cancer conference and letrozole was recommended (referred to Dr. Laurelyn Sickle note from 01/08/2011). The patient did not receive chemotherapy or postmastectomy radiation.  (5) letrozole was started October 2012 (though Dr. Laurelyn Sickle notes clearly state letrozole was started before the definitive surgery, the patient does not recall taking this medication until the fall of 2012). Letrozole was stopped 06/07/2014  (6) tamoxifen started February 2016  PLAN: Rayelynn  Is now 4 years into antiestrogen therapy, with excellent tolerance. The plan in her case is to go for 10 years of antiestrogen, given the fact that she did not receive radiation or chemotherapy.   At this point I feel comfortable seeing her on a once a year basis. She will see me next October. We will obtain lab work in a week before the visit.  She knows to call for any  problems that may develop before then. Chauncey Cruel, MD   04/17/2015 11:07 AM Medical Oncology and Hematology [facility] [address] [place], [place] [address] Tel. [phone]    Fax. [phone]

## 2015-06-06 DIAGNOSIS — M859 Disorder of bone density and structure, unspecified: Secondary | ICD-10-CM | POA: Diagnosis not present

## 2015-07-11 DIAGNOSIS — M859 Disorder of bone density and structure, unspecified: Secondary | ICD-10-CM | POA: Diagnosis not present

## 2015-07-11 DIAGNOSIS — E119 Type 2 diabetes mellitus without complications: Secondary | ICD-10-CM | POA: Diagnosis not present

## 2015-07-11 DIAGNOSIS — I10 Essential (primary) hypertension: Secondary | ICD-10-CM | POA: Diagnosis not present

## 2015-07-11 DIAGNOSIS — E784 Other hyperlipidemia: Secondary | ICD-10-CM | POA: Diagnosis not present

## 2015-07-14 DIAGNOSIS — M859 Disorder of bone density and structure, unspecified: Secondary | ICD-10-CM | POA: Diagnosis not present

## 2015-07-14 DIAGNOSIS — E119 Type 2 diabetes mellitus without complications: Secondary | ICD-10-CM | POA: Diagnosis not present

## 2015-07-14 DIAGNOSIS — C50919 Malignant neoplasm of unspecified site of unspecified female breast: Secondary | ICD-10-CM | POA: Diagnosis not present

## 2015-07-14 DIAGNOSIS — E784 Other hyperlipidemia: Secondary | ICD-10-CM | POA: Diagnosis not present

## 2015-07-14 DIAGNOSIS — E668 Other obesity: Secondary | ICD-10-CM | POA: Diagnosis not present

## 2015-07-14 DIAGNOSIS — H532 Diplopia: Secondary | ICD-10-CM | POA: Diagnosis not present

## 2015-07-14 DIAGNOSIS — M1990 Unspecified osteoarthritis, unspecified site: Secondary | ICD-10-CM | POA: Diagnosis not present

## 2015-07-14 DIAGNOSIS — Z1389 Encounter for screening for other disorder: Secondary | ICD-10-CM | POA: Diagnosis not present

## 2015-07-14 DIAGNOSIS — Z0000 Encounter for general adult medical examination without abnormal findings: Secondary | ICD-10-CM | POA: Diagnosis not present

## 2015-07-14 DIAGNOSIS — I10 Essential (primary) hypertension: Secondary | ICD-10-CM | POA: Diagnosis not present

## 2015-07-14 DIAGNOSIS — Z6829 Body mass index (BMI) 29.0-29.9, adult: Secondary | ICD-10-CM | POA: Diagnosis not present

## 2015-07-18 DIAGNOSIS — Z1212 Encounter for screening for malignant neoplasm of rectum: Secondary | ICD-10-CM | POA: Diagnosis not present

## 2015-08-23 DIAGNOSIS — H2513 Age-related nuclear cataract, bilateral: Secondary | ICD-10-CM | POA: Diagnosis not present

## 2015-08-23 DIAGNOSIS — H40053 Ocular hypertension, bilateral: Secondary | ICD-10-CM | POA: Diagnosis not present

## 2015-11-01 DIAGNOSIS — Z23 Encounter for immunization: Secondary | ICD-10-CM | POA: Diagnosis not present

## 2015-12-13 DIAGNOSIS — Z23 Encounter for immunization: Secondary | ICD-10-CM | POA: Diagnosis not present

## 2015-12-13 DIAGNOSIS — Z7189 Other specified counseling: Secondary | ICD-10-CM | POA: Diagnosis not present

## 2016-01-11 DIAGNOSIS — E668 Other obesity: Secondary | ICD-10-CM | POA: Diagnosis not present

## 2016-01-11 DIAGNOSIS — E119 Type 2 diabetes mellitus without complications: Secondary | ICD-10-CM | POA: Diagnosis not present

## 2016-01-11 DIAGNOSIS — Z7189 Other specified counseling: Secondary | ICD-10-CM | POA: Diagnosis not present

## 2016-01-11 DIAGNOSIS — Z6828 Body mass index (BMI) 28.0-28.9, adult: Secondary | ICD-10-CM | POA: Diagnosis not present

## 2016-01-11 DIAGNOSIS — M1990 Unspecified osteoarthritis, unspecified site: Secondary | ICD-10-CM | POA: Diagnosis not present

## 2016-01-11 DIAGNOSIS — I10 Essential (primary) hypertension: Secondary | ICD-10-CM | POA: Diagnosis not present

## 2016-01-12 DIAGNOSIS — Z23 Encounter for immunization: Secondary | ICD-10-CM | POA: Diagnosis not present

## 2016-03-12 ENCOUNTER — Other Ambulatory Visit: Payer: Self-pay | Admitting: Oncology

## 2016-03-16 DIAGNOSIS — Z23 Encounter for immunization: Secondary | ICD-10-CM | POA: Diagnosis not present

## 2016-03-25 ENCOUNTER — Other Ambulatory Visit: Payer: Self-pay | Admitting: Internal Medicine

## 2016-03-25 DIAGNOSIS — Z1231 Encounter for screening mammogram for malignant neoplasm of breast: Secondary | ICD-10-CM

## 2016-04-08 ENCOUNTER — Ambulatory Visit
Admission: RE | Admit: 2016-04-08 | Discharge: 2016-04-08 | Disposition: A | Discharge status: Home / Self Care | Payer: Medicare Other | Source: Ambulatory Visit | Attending: Internal Medicine | Admitting: Internal Medicine

## 2016-04-08 DIAGNOSIS — Z1231 Encounter for screening mammogram for malignant neoplasm of breast: Secondary | ICD-10-CM

## 2016-04-11 ENCOUNTER — Other Ambulatory Visit (HOSPITAL_BASED_OUTPATIENT_CLINIC_OR_DEPARTMENT_OTHER): Payer: Medicare Other

## 2016-04-11 DIAGNOSIS — C50929 Malignant neoplasm of unspecified site of unspecified male breast: Secondary | ICD-10-CM

## 2016-04-11 DIAGNOSIS — C50512 Malignant neoplasm of lower-outer quadrant of left female breast: Secondary | ICD-10-CM

## 2016-04-11 DIAGNOSIS — C773 Secondary and unspecified malignant neoplasm of axilla and upper limb lymph nodes: Secondary | ICD-10-CM | POA: Diagnosis not present

## 2016-04-11 LAB — CBC WITH DIFFERENTIAL/PLATELET
BASO%: 0.5 % (ref 0.0–2.0)
Basophils Absolute: 0 10*3/uL (ref 0.0–0.1)
EOS%: 1.5 % (ref 0.0–7.0)
Eosinophils Absolute: 0.1 10*3/uL (ref 0.0–0.5)
HCT: 39.2 % (ref 34.8–46.6)
HGB: 12.8 g/dL (ref 11.6–15.9)
LYMPH%: 38.2 % (ref 14.0–49.7)
MCH: 30.4 pg (ref 25.1–34.0)
MCHC: 32.7 g/dL (ref 31.5–36.0)
MCV: 93.1 fL (ref 79.5–101.0)
MONO#: 0.6 10*3/uL (ref 0.1–0.9)
MONO%: 10.2 % (ref 0.0–14.0)
NEUT#: 3 10*3/uL (ref 1.5–6.5)
NEUT%: 49.6 % (ref 38.4–76.8)
Platelets: 219 10*3/uL (ref 145–400)
RBC: 4.21 10*6/uL (ref 3.70–5.45)
RDW: 14 % (ref 11.2–14.5)
WBC: 6 10*3/uL (ref 3.9–10.3)
lymph#: 2.3 10*3/uL (ref 0.9–3.3)

## 2016-04-11 LAB — COMPREHENSIVE METABOLIC PANEL
ALT: 19 U/L (ref 0–55)
ANION GAP: 6 meq/L (ref 3–11)
AST: 20 U/L (ref 5–34)
Albumin: 4 g/dL (ref 3.5–5.0)
Alkaline Phosphatase: 50 U/L (ref 40–150)
BUN: 17 mg/dL (ref 7.0–26.0)
CHLORIDE: 111 meq/L — AB (ref 98–109)
CO2: 24 meq/L (ref 22–29)
CREATININE: 0.7 mg/dL (ref 0.6–1.1)
Calcium: 10.3 mg/dL (ref 8.4–10.4)
EGFR: 89 mL/min/{1.73_m2} — ABNORMAL LOW (ref >90)
Glucose: 97 mg/dl (ref 70–140)
Potassium: 4.6 mEq/L (ref 3.5–5.1)
Sodium: 141 mEq/L (ref 136–145)
Total Bilirubin: 0.65 mg/dL (ref 0.20–1.20)
Total Protein: 6.9 g/dL (ref 6.4–8.3)

## 2016-04-18 ENCOUNTER — Other Ambulatory Visit: Payer: Self-pay | Admitting: *Deleted

## 2016-04-18 ENCOUNTER — Ambulatory Visit (HOSPITAL_BASED_OUTPATIENT_CLINIC_OR_DEPARTMENT_OTHER): Payer: Medicare Other | Admitting: Oncology

## 2016-04-18 VITALS — BP 144/73 | HR 71 | Temp 98.2°F | Resp 18 | Ht 66.0 in | Wt 183.7 lb

## 2016-04-18 DIAGNOSIS — M8580 Other specified disorders of bone density and structure, unspecified site: Secondary | ICD-10-CM | POA: Diagnosis not present

## 2016-04-18 DIAGNOSIS — C50912 Malignant neoplasm of unspecified site of left female breast: Secondary | ICD-10-CM

## 2016-04-18 DIAGNOSIS — C773 Secondary and unspecified malignant neoplasm of axilla and upper limb lymph nodes: Secondary | ICD-10-CM | POA: Diagnosis not present

## 2016-04-18 DIAGNOSIS — C50512 Malignant neoplasm of lower-outer quadrant of left female breast: Secondary | ICD-10-CM | POA: Diagnosis not present

## 2016-04-18 DIAGNOSIS — Z170 Estrogen receptor positive status [ER+]: Principal | ICD-10-CM

## 2016-04-18 MED ORDER — TAMOXIFEN CITRATE 20 MG PO TABS: 20.0000 mg | ORAL_TABLET | Freq: Every day | ORAL | 3 refills | Status: DC

## 2016-04-18 NOTE — Progress Notes (Signed)
Jennifer Rivera  [phone] [phone]     ID: Jennifer Rivera DOB: [birth year]  MR#: [id number]  Jennifer Rivera#:[id number]  Patient Care Team: Jennifer Baton, MD as PCP - General (Internal Medicine) Jennifer Silversmith, MD as Consulting Physician (Radiation Oncology) Jennifer Cruel, MD as Consulting Physician (Oncology) OTHER MD: Jennifer Rivera M.D., Jennifer Kato MD  CHIEF COMPLAINT: Estrogen receptor positive breast cancer  CURRENT TREATMENT: Tamoxifen   BREAST CANCER HISTORY: From Dr.Kalsoom Rivera's 09/12/2010 intake note:  "Patient is a very pleasant 70 year old female from [place], [place] with medical history significant for hyperlipidemia, hypertension, type 2 diabetes, and chronic nonspecific rash.  Patient's diabetes is diet controlled.  She is on Crestor for her hyperlipidemia.  Patient began having screening mammograms at the age of 52.  On 08/23/2010, she presented for a screening mammogram that showed possible abnormality in the left breast superiorly.  There was a question of breast architectural distortion.  Additional imaging was recommended.  The right breast demonstrated what appear to be asymmetric fibroglandular structures in the axillary tail of the right breast.  Patient went onto have unilateral diagnostic mammogram of the left breast.  This showed small stellate nodule measuring up to 1.2 cm in diameter.  Left breast ultrasound demonstrated 6.0 mm in diameter solid nodule with dense posterior shadowing corresponding to what was seen mammographically.  Ultrasound-guided needle core biopsy was discussed and she went onto have this performed on 09/06/2010.  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  She then went onto have MRI of the breasts performed, the results of which are not available to me in the written format, but I  reviewed the MRI imaging at the Multidisciplinary Breast Conference this morning.  There was noted to be several suspicious areas.  The first primary area measured about 1.5 cm.  The second area measured about 2.7 cm, and the third area just posteriorly was 6.0 mm.  The second and the third area on the MRI are scheduled to be biopsied in the next few days."  Her subsequent history is detailed below.  INTERVAL HISTORY: Jennifer Rivera returns today for follow-up of her early stage breast cancer. She tolerates tamoxifen well, with no significant hot flashes or vaginal dryness or wetness problems. She obtains it at approximately $3 a month.  REVIEW OF SYSTEMS: She is doing "fine". She continues her excellent exercise program of walking 3-5 miles at least 5 days a week. 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 A detailed review of systems today was otherwise noncontributory  PAST MEDICAL HISTORY: Past Medical History:  Diagnosis Date  . 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 Breast cancer   . Diabetes mellitus   . Hyperlipidemia    controlled  . Pain     PAST SURGICAL HISTORY: Past Surgical History:  Procedure Laterality Date  . BREAST SURGERY     mastectomy -lt  . GANGLION CYST EXCISION    . TUBAL LIGATION      FAMILY HISTORY Family History  Problem Relation Age of Onset  . Heart disease Father     heart attack   the patient's father died at the age of 98 from a myocardial infarction. The patient's mother died at the age of 41 with dementia. The patient  is an only child.  GYNECOLOGIC HISTORY:  No LMP recorded. Patient is postmenopausal. menarche age 36, first live birth age 1. She is GX P3. She went through the change of life in her early 41s, and was on hormone replacement for more than 10 years.   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 Her husband Jennifer Rivera of course is a Occupational hygienist. Daughter Jennifer Rivera in fine arts degree and works as a Buyer, retail. Son Jennifer Rivera is an Recruitment consultant in [place]. Son Jennifer Rivera studied at [employer] but now works in a semi-independent living in [place] and works at [employer]. The patient has 3 grandchildren. She at tends [place]: In place. The patient's husband is her healthcare power of attorney   HEALTH MAINTENANCE: Social History  Substance Use Topics  . Smoking status: Never Smoker  . Smokeless tobacco: Not on file  . Alcohol use No     Colonoscopy:  PAP:  Bone density: 12/29/2012 showed osteopenia  Lipid panel:  Allergies  Allergen Reactions  . Sulfa Antibiotics Rash    Current Outpatient Prescriptions  Medication Sig Dispense Refill  . CRESTOR 10 MG tablet 10 mg daily.     Marland Kitchen ibuprofen (ADVIL,MOTRIN) 800 MG tablet Take 800 mg by mouth 2 (two) times daily.    . metFORMIN (GLUCOPHAGE) 1000 MG tablet Take 1,000 mg by mouth daily.     . tamoxifen (NOLVADEX) 20 MG tablet Take 1 tablet (20 mg total) by mouth daily. 90 tablet 3   No current facility-administered medications for this visit.     OBJECTIVE: Middle-aged white woman Who appears well Vitals:   04/18/16 1013  BP: (!) 144/73  Pulse: 71  Resp: 18  Temp: 98.2 F (36.8 C)     Body mass index is 29.65 kg/m.    ECOG FS:0 - Asymptomatic  Sclerae unicteric, pupils round and equal Oropharynx clear and moist-- no thrush or other lesions No cervical or supraclavicular adenopathy Lungs no rales or rhonchi Heart regular rate and rhythm Abd soft, nontender, positive bowel sounds MSK no focal spinal tenderness, no upper extremity lymphedema Neuro: nonfocal, well oriented, appropriate affect Breasts: The right breast is unremarkable. The left breast is status post mastectomy with reconstruction. The cosmetic result is good. There is no  evidence of local recurrence. The left axilla is benign.   LAB RESULTS:  CMP     Component Value Date/Time   NA 141 04/11/2016 1014   K 4.6 04/11/2016 1014   CL 108 (H) 07/02/2012 1001   CO2 24 04/11/2016 1014   GLUCOSE 97 04/11/2016 1014   GLUCOSE 110 (H) 07/02/2012 1001   BUN 17.0 04/11/2016 1014   CREATININE 0.7 04/11/2016 1014   CALCIUM 10.3 04/11/2016 1014   PROT 6.9 04/11/2016 1014   ALBUMIN 4.0 04/11/2016 1014   AST 20 04/11/2016 1014   ALT 19 04/11/2016 1014   ALKPHOS 50 04/11/2016 1014   BILITOT 0.65 04/11/2016 1014   GFRNONAA >90 04/08/2011 1100   GFRAA >90 04/08/2011 1100    INo results found for: SPEP, UPEP  Lab Results  Component Value Date   WBC 6.0 04/11/2016   NEUTROABS 3.0 04/11/2016   HGB 12.8 04/11/2016   HCT 39.2 04/11/2016   MCV 93.1 04/11/2016   PLT 219 04/11/2016      Chemistry      Component Value Date/Time   NA 141 04/11/2016 1014  K 4.6 04/11/2016 1014   CL 108 (H) 07/02/2012 1001   CO2 24 04/11/2016 1014   BUN 17.0 04/11/2016 1014   CREATININE 0.7 04/11/2016 1014      Component Value Date/Time   CALCIUM 10.3 04/11/2016 1014   ALKPHOS 50 04/11/2016 1014   AST 20 04/11/2016 1014   ALT 19 04/11/2016 1014   BILITOT 0.65 04/11/2016 1014       Lab Results  Component Value Date   LABCA2 14 09/12/2010    No components found for: [id number]  No results for input(s): INR in the last 168 hours.  Urinalysis No results found for: COLORURINE, APPEARANCEUR, LABSPEC, PHURINE, GLUCOSEU, HGBUR, BILIRUBINUR, KETONESUR, PROTEINUR, UROBILINOGEN, NITRITE, LEUKOCYTESUR  STUDIES: Mm Screening Breast Tomo Uni R  Result Date: 04/09/2016 CLINICAL DATA:  Screening. EXAM: 2D DIGITAL SCREENING UNILATERAL RIGHT MAMMOGRAM WITH CAD AND ADJUNCT TOMO COMPARISON:  Previous exam(s). ACR Breast Density Category c: The breast tissue is heterogeneously dense, which may obscure small masses. FINDINGS: There are no findings suspicious for malignancy. Images  were processed with CAD. IMPRESSION: No mammographic evidence of malignancy. A result letter of this screening mammogram will be mailed directly to the patient. RECOMMENDATION: Screening mammogram in one year. (Code:SM-B-01Y) BI-RADS CATEGORY  1: Negative. Electronically Signed   By: Lajean Manes M.D.   On: 04/09/2016 12:50   ASSESSMENT: 70 y.o. [place] woman status post left breast biopsy 09/06/2010 for a clinically multifocal T2 NX invasive ductal carcinoma, low-grade, estrogen receptor 96% positive, progesterone receptor 91% positive, with an MIB-1 of 17%, and no HER-2 amplification, the signals ratio being 1.11([id number])  (1) additional biopsies from the left breast lower outer quadrant 09/20/2010 showed again low-grade invasive ductal carcinoma. The prognostic panel was estrogen receptor positive at 97%, progesterone receptor positive at 82%, with an MIB-1 of 10% and no HER-2 amplification, the signals ratio being 0.90. (SAA [id number])  (2) Oncotype DX obtained from the 09/20/2010 surgery showed a recurrent score of 11 predicting (in node-negative patients) a risk of outside the breast recurrence of 8% if her only systemic treatment is tamoxifen for 5 years. In this group of patients, this recurrent score also predicts no benefit from chemotherapy  (3) status post left mastectomy with axillary lymph node dissection (a total of 15 lymph nodes removed) 12/24/2010 for an mpT1c pN1a, stage IIA invasive ductal carcinoma, grade 1, with ample margins and immediate expander placement  (a) saline implant placement and right mastopexy performed 04/10/2011  (4). The patient's case was presented at the multidisciplinary breast cancer conference and letrozole was recommended (referred to Dr. Laurelyn Sickle note from 01/08/2011). The patient did not receive chemotherapy or postmastectomy radiation.  (5) letrozole was started October 2012 (though Dr. Laurelyn Sickle notes state letrozole was started before the definitive  surgery, the patient does not recall taking this medication until the fall of 2012). Letrozole was stopped 06/07/2014  (6) tamoxifen started February 2016  PLAN: Ayo  Is now a little over 5 years out from definitive surgery for her breast cancer with no evidence of disease recurrence. This is very favorable.  She is tolerating tamoxifen with no side effects that she is aware of. She obtains it at a good price. The plan at this point is to continue for a total of 10 years, counting of course from the started letrozole in October 2012.  She will see me again in a year. She will have lab work and her mammogram (with tomography given that her breast density is still category C)  prior to that visit. She knows to call for any other problems that may develop before then.   Jennifer Cruel, MD   04/18/2016 3:04 PM Medical Oncology and Hematology [facility] [address] [place], [place] [address] Tel. [phone]    Fax. [phone]

## 2016-07-09 DIAGNOSIS — E119 Type 2 diabetes mellitus without complications: Secondary | ICD-10-CM | POA: Diagnosis not present

## 2016-07-09 DIAGNOSIS — I10 Essential (primary) hypertension: Secondary | ICD-10-CM | POA: Diagnosis not present

## 2016-07-09 DIAGNOSIS — M859 Disorder of bone density and structure, unspecified: Secondary | ICD-10-CM | POA: Diagnosis not present

## 2016-07-09 DIAGNOSIS — E784 Other hyperlipidemia: Secondary | ICD-10-CM | POA: Diagnosis not present

## 2016-07-11 DIAGNOSIS — Z23 Encounter for immunization: Secondary | ICD-10-CM | POA: Diagnosis not present

## 2016-07-16 DIAGNOSIS — Z0000 Encounter for general adult medical examination without abnormal findings: Secondary | ICD-10-CM | POA: Diagnosis not present

## 2016-07-16 DIAGNOSIS — E668 Other obesity: Secondary | ICD-10-CM | POA: Diagnosis not present

## 2016-07-16 DIAGNOSIS — Z1389 Encounter for screening for other disorder: Secondary | ICD-10-CM | POA: Diagnosis not present

## 2016-07-16 DIAGNOSIS — E784 Other hyperlipidemia: Secondary | ICD-10-CM | POA: Diagnosis not present

## 2016-07-16 DIAGNOSIS — Z6829 Body mass index (BMI) 29.0-29.9, adult: Secondary | ICD-10-CM | POA: Diagnosis not present

## 2016-07-16 DIAGNOSIS — M859 Disorder of bone density and structure, unspecified: Secondary | ICD-10-CM | POA: Diagnosis not present

## 2016-07-16 DIAGNOSIS — E119 Type 2 diabetes mellitus without complications: Secondary | ICD-10-CM | POA: Diagnosis not present

## 2016-07-16 DIAGNOSIS — M1990 Unspecified osteoarthritis, unspecified site: Secondary | ICD-10-CM | POA: Diagnosis not present

## 2016-07-16 DIAGNOSIS — C50919 Malignant neoplasm of unspecified site of unspecified female breast: Secondary | ICD-10-CM | POA: Diagnosis not present

## 2016-07-16 DIAGNOSIS — I10 Essential (primary) hypertension: Secondary | ICD-10-CM | POA: Diagnosis not present

## 2016-08-27 DIAGNOSIS — H2513 Age-related nuclear cataract, bilateral: Secondary | ICD-10-CM | POA: Diagnosis not present

## 2016-08-27 DIAGNOSIS — H40013 Open angle with borderline findings, low risk, bilateral: Secondary | ICD-10-CM | POA: Diagnosis not present

## 2016-08-27 DIAGNOSIS — E119 Type 2 diabetes mellitus without complications: Secondary | ICD-10-CM | POA: Diagnosis not present

## 2017-01-24 DIAGNOSIS — M859 Disorder of bone density and structure, unspecified: Secondary | ICD-10-CM | POA: Diagnosis not present

## 2017-01-24 DIAGNOSIS — E668 Other obesity: Secondary | ICD-10-CM | POA: Diagnosis not present

## 2017-01-24 DIAGNOSIS — I10 Essential (primary) hypertension: Secondary | ICD-10-CM | POA: Diagnosis not present

## 2017-01-24 DIAGNOSIS — Z6828 Body mass index (BMI) 28.0-28.9, adult: Secondary | ICD-10-CM | POA: Diagnosis not present

## 2017-01-24 DIAGNOSIS — E119 Type 2 diabetes mellitus without complications: Secondary | ICD-10-CM | POA: Diagnosis not present

## 2017-01-24 DIAGNOSIS — C50919 Malignant neoplasm of unspecified site of unspecified female breast: Secondary | ICD-10-CM | POA: Diagnosis not present

## 2017-02-18 ENCOUNTER — Other Ambulatory Visit: Payer: Self-pay | Admitting: Oncology

## 2017-02-18 DIAGNOSIS — Z1231 Encounter for screening mammogram for malignant neoplasm of breast: Secondary | ICD-10-CM

## 2017-03-15 DIAGNOSIS — Z23 Encounter for immunization: Secondary | ICD-10-CM | POA: Diagnosis not present

## 2017-04-10 ENCOUNTER — Other Ambulatory Visit: Payer: Medicare Other

## 2017-04-11 ENCOUNTER — Other Ambulatory Visit (HOSPITAL_BASED_OUTPATIENT_CLINIC_OR_DEPARTMENT_OTHER): Payer: Medicare Other

## 2017-04-11 DIAGNOSIS — C773 Secondary and unspecified malignant neoplasm of axilla and upper limb lymph nodes: Secondary | ICD-10-CM | POA: Diagnosis not present

## 2017-04-11 DIAGNOSIS — Z170 Estrogen receptor positive status [ER+]: Principal | ICD-10-CM

## 2017-04-11 DIAGNOSIS — C50912 Malignant neoplasm of unspecified site of left female breast: Secondary | ICD-10-CM

## 2017-04-11 DIAGNOSIS — C50512 Malignant neoplasm of lower-outer quadrant of left female breast: Secondary | ICD-10-CM | POA: Diagnosis present

## 2017-04-11 LAB — COMPREHENSIVE METABOLIC PANEL
ALBUMIN: 4 g/dL (ref 3.5–5.0)
ALK PHOS: 44 U/L (ref 40–150)
ALT: 19 U/L (ref 0–55)
ANION GAP: 6 meq/L (ref 3–11)
AST: 24 U/L (ref 5–34)
BILIRUBIN TOTAL: 0.86 mg/dL (ref 0.20–1.20)
BUN: 14.3 mg/dL (ref 7.0–26.0)
CO2: 25 mEq/L (ref 22–29)
CREATININE: 0.7 mg/dL (ref 0.6–1.1)
Calcium: 10.1 mg/dL (ref 8.4–10.4)
Chloride: 110 mEq/L — ABNORMAL HIGH (ref 98–109)
Glucose: 96 mg/dl (ref 70–140)
Potassium: 4.4 mEq/L (ref 3.5–5.1)
Sodium: 142 mEq/L (ref 136–145)
TOTAL PROTEIN: 6.5 g/dL (ref 6.4–8.3)

## 2017-04-11 LAB — CBC WITH DIFFERENTIAL/PLATELET
BASO%: 0.6 % (ref 0.0–2.0)
Basophils Absolute: 0 10*3/uL (ref 0.0–0.1)
EOS ABS: 0.1 10*3/uL (ref 0.0–0.5)
EOS%: 2.7 % (ref 0.0–7.0)
HEMATOCRIT: 40 % (ref 34.8–46.6)
HEMOGLOBIN: 12.9 g/dL (ref 11.6–15.9)
LYMPH#: 1.9 10*3/uL (ref 0.9–3.3)
LYMPH%: 36.5 % (ref 14.0–49.7)
MCH: 30.4 pg (ref 25.1–34.0)
MCHC: 32.3 g/dL (ref 31.5–36.0)
MCV: 94.3 fL (ref 79.5–101.0)
MONO#: 0.5 10*3/uL (ref 0.1–0.9)
MONO%: 9.9 % (ref 0.0–14.0)
NEUT%: 50.3 % (ref 38.4–76.8)
NEUTROS ABS: 2.6 10*3/uL (ref 1.5–6.5)
PLATELETS: 199 10*3/uL (ref 145–400)
RBC: 4.24 10*6/uL (ref 3.70–5.45)
RDW: 13.8 % (ref 11.2–14.5)
WBC: 5.2 10*3/uL (ref 3.9–10.3)

## 2017-04-14 ENCOUNTER — Other Ambulatory Visit: Payer: Self-pay | Admitting: Oncology

## 2017-04-14 ENCOUNTER — Ambulatory Visit
Admission: RE | Admit: 2017-04-14 | Discharge: 2017-04-14 | Disposition: A | Discharge status: Home / Self Care | Payer: Medicare Other | Source: Ambulatory Visit | Attending: Oncology | Admitting: Oncology

## 2017-04-14 DIAGNOSIS — Z1231 Encounter for screening mammogram for malignant neoplasm of breast: Secondary | ICD-10-CM | POA: Diagnosis not present

## 2017-04-15 NOTE — Progress Notes (Signed)
Jennifer Rivera  Telephone:([phone] Fax:(336) [phone]     ID: Jennifer Rivera DOB: 1945-08-23  MR#: [id number]  ZWC#:[id number]  Patient Care Team: Jennifer Baton, MD as PCP - General (Internal Medicine) Jennifer Silversmith, MD (Inactive) as Consulting Physician (Radiation Oncology) Jennifer Rivera, Jennifer Dad, MD as Consulting Physician (Oncology) OTHER MD: Jennifer Rivera M.D., Jennifer Kato MD  CHIEF COMPLAINT: Estrogen receptor positive breast cancer  CURRENT TREATMENT: Tamoxifen   BREAST CANCER HISTORY: From JenniferKalsoom Rivera's 09/12/2010 intake note:  "Patient is a very pleasant 71 year old female from [place], [place] with medical history significant for hyperlipidemia, hypertension, type 2 diabetes, and chronic nonspecific rash.  Patient's diabetes is diet controlled.  She is on Crestor for her hyperlipidemia.  Patient began having screening mammograms at the age of 16.  On 08/23/2010, she presented for a screening mammogram that showed possible abnormality in the left breast superiorly.  There was a question of breast architectural distortion.  Additional imaging was recommended.  The right breast demonstrated what appear to be asymmetric fibroglandular structures in the axillary tail of the right breast.  Patient went onto have unilateral diagnostic mammogram of the left breast.  This showed small stellate nodule measuring up to 1.2 cm in diameter.  Left breast ultrasound demonstrated 6.0 mm in diameter solid nodule with dense posterior shadowing corresponding to what was seen mammographically.  Ultrasound-guided needle core biopsy was discussed and she went onto have this performed on 09/06/2010.  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  She then went onto have MRI of the breasts performed, the results of which are not available to me in the written  format, but I reviewed the MRI imaging at the Multidisciplinary Breast Conference this morning.  There was noted to be several suspicious areas.  The first primary area measured about 1.5 cm.  The second area measured about 2.7 cm, and the third area just posteriorly was 6.0 mm.  The second and the third area on the MRI are scheduled to be biopsied in the next few days."  Her subsequent history is detailed below.  INTERVAL HISTORY: Jennifer Rivera returns today for follow-up and treatment of her estrogen receptor positive breast cancer.  She continues on tamoxifen, which she tolerates well. She denies hot flashes or vaginal dryness.  REVIEW OF SYSTEMS: Jennifer Rivera reports she is doing well. She walks 2-4 miles per day despite the pain in the right leg secondary to her remote accident. She doesn't go to the gym.  She still has a little bit of numbness in the left shoulder left arm area associated with her remote surgery she denies unusual headaches, visual changes, nausea, vomiting, or dizziness. There has been no unusual cough, phlegm production, or pleurisy. This been no change in bowel or bladder habits. She denies unexplained fatigue or unexplained weight loss, bleeding, rash, or fever. A detailed review of systems was otherwise entirely stable.   PAST MEDICAL HISTORY: Past Medical History:  Diagnosis Date  . 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 Breast cancer ([place])   . Diabetes mellitus   . Hyperlipidemia    controlled  . Pain     PAST SURGICAL HISTORY: Past Surgical History:  Procedure Laterality Date  . BREAST SURGERY     mastectomy -lt  . GANGLION CYST EXCISION    . MASTECTOMY Left 2012  . REDUCTION MAMMAPLASTY Right   . TUBAL LIGATION  FAMILY HISTORY Family History  Problem Relation Age of Onset  . Heart disease Father        heart attack   the patient's father died at the age of 96 from a myocardial infarction. The patient's mother died at the age of 8 with dementia.  The patient is an only child.  GYNECOLOGIC HISTORY:  No LMP recorded. Patient is postmenopausal. menarche age 73, first live birth age 46. She is GX P3. She went through the change of life in her early 71s, and was on hormone replacement for more than 10 years.   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 Her husband Jennifer Rivera of course is a Occupational hygienist. Daughter Jennifer Rivera acid Jennifer Rivera Masters in fine arts degree and works as a Buyer, retail. Son Jennifer Rivera is an Recruitment consultant in [place]. Son Jennifer Rivera studied at [employer] but now works in a semi-independent living in [place] and works at [employer]. The patient has 3 grandchildren. She at tends [place]: In place. The patient's husband is her healthcare power of attorney   HEALTH MAINTENANCE: Social History  Substance Use Topics  . Smoking status: Never Smoker  . Smokeless tobacco: Not on file  . Alcohol use No     Colonoscopy:  PAP:  Bone density: 12/29/2012 showed osteopenia  Lipid panel:  Allergies  Allergen Reactions  . Sulfa Antibiotics Rash    Current Outpatient Prescriptions  Medication Sig Dispense Refill  . CRESTOR 10 MG tablet 10 mg daily.     Marland Kitchen ibuprofen (ADVIL,MOTRIN) 800 MG tablet Take 800 mg by mouth 2 (two) times daily.    . metFORMIN (GLUCOPHAGE) 1000 MG tablet Take 1,000 mg by mouth daily.     . tamoxifen (NOLVADEX) 20 MG tablet TAKE 1 TABLET DAILY 90 tablet 3   No current facility-administered medications for this visit.     OBJECTIVE: Middle-aged white woman in no acute distress Vitals:   04/17/17 1016  BP: 137/78  Pulse: 75  Resp: 17  Temp: 98.2 F (36.8 C)  SpO2: 99%     Body mass index is 29.49 kg/m.    ECOG FS:1 - Symptomatic but completely ambulatory  Sclerae unicteric, EOMs intact Oropharynx clear and moist No cervical or supraclavicular adenopathy Lungs no rales or rhonchi Heart regular rate and rhythm Abd soft, nontender,  positive bowel sounds MSK no focal spinal tenderness, no upper extremity lymphedema Neuro: nonfocal, well oriented, appropriate affect Breasts: The right breast is benign.  Left breast is undergone mastectomy with reconstruction, with good cosmetic result.  There is no evidence of chest wall recurrence or local recurrence.  Both axillae are benign.  LAB RESULTS:  CMP     Component Value Date/Time   NA 142 04/11/2017 1001   K 4.4 04/11/2017 1001   CL 108 (H) 07/02/2012 1001   CO2 25 04/11/2017 1001   GLUCOSE 96 04/11/2017 1001   GLUCOSE 110 (H) 07/02/2012 1001   BUN 14.3 04/11/2017 1001   CREATININE 0.7 04/11/2017 1001   CALCIUM 10.1 04/11/2017 1001   PROT 6.5 04/11/2017 1001   ALBUMIN 4.0 04/11/2017 1001   AST 24 04/11/2017 1001   ALT 19 04/11/2017 1001   ALKPHOS 44 04/11/2017 1001   BILITOT 0.86 04/11/2017 1001   GFRNONAA >90 04/08/2011 1100   GFRAA >90 04/08/2011 1100    INo results found for: SPEP, UPEP  Lab Results  Component Value Date  WBC 5.2 04/11/2017   NEUTROABS 2.6 04/11/2017   HGB 12.9 04/11/2017   HCT 40.0 04/11/2017   MCV 94.3 04/11/2017   PLT 199 04/11/2017      Chemistry      Component Value Date/Time   NA 142 04/11/2017 1001   K 4.4 04/11/2017 1001   CL 108 (H) 07/02/2012 1001   CO2 25 04/11/2017 1001   BUN 14.3 04/11/2017 1001   CREATININE 0.7 04/11/2017 1001      Component Value Date/Time   CALCIUM 10.1 04/11/2017 1001   ALKPHOS 44 04/11/2017 1001   AST 24 04/11/2017 1001   ALT 19 04/11/2017 1001   BILITOT 0.86 04/11/2017 1001       Lab Results  Component Value Date   LABCA2 14 09/12/2010    No components found for: [id number]  No results for input(s): INR in the last 168 hours.  Urinalysis No results found for: COLORURINE, APPEARANCEUR, LABSPEC, PHURINE, GLUCOSEU, HGBUR, BILIRUBINUR, KETONESUR, PROTEINUR, UROBILINOGEN, NITRITE, LEUKOCYTESUR  STUDIES: Mm Screening Breast Tomo Uni R  Result Date: 04/15/2017 CLINICAL DATA:   Screening. EXAM: 2D DIGITAL SCREENING UNILATERAL RIGHT MAMMOGRAM WITH CAD AND ADJUNCT TOMO COMPARISON:  Previous exam(s). ACR Breast Density Category c: The breast tissue is heterogeneously dense, which may obscure small masses. FINDINGS: There are no findings suspicious for malignancy. Images were processed with CAD. IMPRESSION: No mammographic evidence of malignancy. A result letter of this screening mammogram will be mailed directly to the patient. RECOMMENDATION: Screening mammogram in one year. (Code:SM-B-01Y) BI-RADS CATEGORY  1: Negative. Electronically Signed   By: Lajean Manes M.D.   On: 04/15/2017 10:21   ASSESSMENT: 71 y.o. [place] woman status post left breast biopsy 09/06/2010 for a clinically multifocal T2 NX invasive ductal carcinoma, low-grade, estrogen receptor 96% positive, progesterone receptor 91% positive, with an MIB-1 of 17%, and no HER-2 amplification, the signals ratio being 1.11([id number])  (1) additional biopsies from the left breast lower outer quadrant 09/20/2010 showed again low-grade invasive ductal carcinoma. The prognostic panel was estrogen receptor positive at 97%, progesterone receptor positive at 82%, with an MIB-1 of 10% and no HER-2 amplification, the signals ratio being 0.90. ([id number] [id number])  (2) Oncotype DX obtained from the 09/20/2010 surgery showed a recurrent score of 11 predicting (in node-negative patients) a risk of outside the breast recurrence of 8% if her only systemic treatment is tamoxifen for 5 years. In this group of patients, this recurrent score also predicts no benefit from chemotherapy  (3) status post left mastectomy with axillary lymph node dissection (a total of 15 lymph nodes removed) 12/24/2010 for an mpT1c pN1a, stage IIA invasive ductal carcinoma, grade 1, with ample margins and immediate expander placement  (a) saline implant placement and right mastopexy performed 04/10/2011  (4). The patient's case was presented at the  multidisciplinary breast cancer conference and letrozole was recommended (referred to Dr. Laurelyn Sickle note from 01/08/2011). The patient did not receive chemotherapy or postmastectomy radiation.  (5) letrozole was started October 2012 (though Dr. Laurelyn Sickle notes state letrozole was started before the definitive surgery, the patient does not recall taking this medication until the fall of 2012). Letrozole was stopped 06/07/2014  (6) tamoxifen started February 2016  PLAN: Aysa is now a little over 6 years out from definitive surgery for her breast cancer with no evidence of disease recurrence.  This is very favorable.  She continues on tamoxifen with good tolerance.  Plan is for a total of 10 years of antiestrogens counting from October 2012.  I commended her excellent exercise program  Her breasts are still a little bit on the dense side likely because she took hormone replacement all those years.  She will let me know if any other problems develop.  Otherwise I will see her again in 1  Vedanshi Massaro, Jennifer Dad, MD  04/17/17 10:24 AM Medical Oncology and Hematology [facility] [address] [place], [place] [address] Tel. [phone]    Fax. [phone]  This document serves as a record of services personally performed by Chauncey Cruel, MD. It was created on his behalf by Margit Banda, a trained medical scribe. The creation of this record is based on the scribe's personal observations and the provider's statements to them. This document has been checked and approved by the attending provider.

## 2017-04-17 ENCOUNTER — Telehealth: Payer: Self-pay | Admitting: Oncology

## 2017-04-17 ENCOUNTER — Ambulatory Visit (HOSPITAL_BASED_OUTPATIENT_CLINIC_OR_DEPARTMENT_OTHER): Payer: Medicare Other | Admitting: Oncology

## 2017-04-17 DIAGNOSIS — C773 Secondary and unspecified malignant neoplasm of axilla and upper limb lymph nodes: Secondary | ICD-10-CM | POA: Diagnosis not present

## 2017-04-17 DIAGNOSIS — Z170 Estrogen receptor positive status [ER+]: Principal | ICD-10-CM

## 2017-04-17 DIAGNOSIS — M8580 Other specified disorders of bone density and structure, unspecified site: Secondary | ICD-10-CM | POA: Diagnosis not present

## 2017-04-17 DIAGNOSIS — C50512 Malignant neoplasm of lower-outer quadrant of left female breast: Secondary | ICD-10-CM

## 2017-04-17 DIAGNOSIS — C50812 Malignant neoplasm of overlapping sites of left female breast: Secondary | ICD-10-CM | POA: Insufficient documentation

## 2017-04-17 DIAGNOSIS — R922 Inconclusive mammogram: Secondary | ICD-10-CM

## 2017-04-17 MED ORDER — TAMOXIFEN CITRATE 20 MG PO TABS: 20.0000 mg | ORAL_TABLET | Freq: Every day | ORAL | 5 refills | Status: AC

## 2017-04-17 NOTE — Telephone Encounter (Signed)
Scheduled appt per 11/1 los. Patient did not want avs or calendar.

## 2017-07-10 DIAGNOSIS — I10 Essential (primary) hypertension: Secondary | ICD-10-CM | POA: Diagnosis not present

## 2017-07-10 DIAGNOSIS — M859 Disorder of bone density and structure, unspecified: Secondary | ICD-10-CM | POA: Diagnosis not present

## 2017-07-10 DIAGNOSIS — E7849 Other hyperlipidemia: Secondary | ICD-10-CM | POA: Diagnosis not present

## 2017-07-10 DIAGNOSIS — R82998 Other abnormal findings in urine: Secondary | ICD-10-CM | POA: Diagnosis not present

## 2017-07-10 DIAGNOSIS — E119 Type 2 diabetes mellitus without complications: Secondary | ICD-10-CM | POA: Diagnosis not present

## 2017-07-14 DIAGNOSIS — M9905 Segmental and somatic dysfunction of pelvic region: Secondary | ICD-10-CM | POA: Diagnosis not present

## 2017-07-14 DIAGNOSIS — M9903 Segmental and somatic dysfunction of lumbar region: Secondary | ICD-10-CM | POA: Diagnosis not present

## 2017-07-14 DIAGNOSIS — M9902 Segmental and somatic dysfunction of thoracic region: Secondary | ICD-10-CM | POA: Diagnosis not present

## 2017-07-14 DIAGNOSIS — M5136 Other intervertebral disc degeneration, lumbar region: Secondary | ICD-10-CM | POA: Diagnosis not present

## 2017-07-14 DIAGNOSIS — M5135 Other intervertebral disc degeneration, thoracolumbar region: Secondary | ICD-10-CM | POA: Diagnosis not present

## 2017-07-14 DIAGNOSIS — M5432 Sciatica, left side: Secondary | ICD-10-CM | POA: Diagnosis not present

## 2017-07-15 DIAGNOSIS — M5432 Sciatica, left side: Secondary | ICD-10-CM | POA: Diagnosis not present

## 2017-07-15 DIAGNOSIS — M9902 Segmental and somatic dysfunction of thoracic region: Secondary | ICD-10-CM | POA: Diagnosis not present

## 2017-07-15 DIAGNOSIS — M9903 Segmental and somatic dysfunction of lumbar region: Secondary | ICD-10-CM | POA: Diagnosis not present

## 2017-07-15 DIAGNOSIS — M5135 Other intervertebral disc degeneration, thoracolumbar region: Secondary | ICD-10-CM | POA: Diagnosis not present

## 2017-07-15 DIAGNOSIS — M5136 Other intervertebral disc degeneration, lumbar region: Secondary | ICD-10-CM | POA: Diagnosis not present

## 2017-07-15 DIAGNOSIS — M9905 Segmental and somatic dysfunction of pelvic region: Secondary | ICD-10-CM | POA: Diagnosis not present

## 2017-07-16 DIAGNOSIS — M5432 Sciatica, left side: Secondary | ICD-10-CM | POA: Diagnosis not present

## 2017-07-16 DIAGNOSIS — M5136 Other intervertebral disc degeneration, lumbar region: Secondary | ICD-10-CM | POA: Diagnosis not present

## 2017-07-16 DIAGNOSIS — M9903 Segmental and somatic dysfunction of lumbar region: Secondary | ICD-10-CM | POA: Diagnosis not present

## 2017-07-16 DIAGNOSIS — M9905 Segmental and somatic dysfunction of pelvic region: Secondary | ICD-10-CM | POA: Diagnosis not present

## 2017-07-16 DIAGNOSIS — M9902 Segmental and somatic dysfunction of thoracic region: Secondary | ICD-10-CM | POA: Diagnosis not present

## 2017-07-16 DIAGNOSIS — M5135 Other intervertebral disc degeneration, thoracolumbar region: Secondary | ICD-10-CM | POA: Diagnosis not present

## 2017-07-17 DIAGNOSIS — M9903 Segmental and somatic dysfunction of lumbar region: Secondary | ICD-10-CM | POA: Diagnosis not present

## 2017-07-17 DIAGNOSIS — M5135 Other intervertebral disc degeneration, thoracolumbar region: Secondary | ICD-10-CM | POA: Diagnosis not present

## 2017-07-17 DIAGNOSIS — I10 Essential (primary) hypertension: Secondary | ICD-10-CM | POA: Diagnosis not present

## 2017-07-17 DIAGNOSIS — M5136 Other intervertebral disc degeneration, lumbar region: Secondary | ICD-10-CM | POA: Diagnosis not present

## 2017-07-17 DIAGNOSIS — M9902 Segmental and somatic dysfunction of thoracic region: Secondary | ICD-10-CM | POA: Diagnosis not present

## 2017-07-17 DIAGNOSIS — Z7189 Other specified counseling: Secondary | ICD-10-CM | POA: Diagnosis not present

## 2017-07-17 DIAGNOSIS — E668 Other obesity: Secondary | ICD-10-CM | POA: Diagnosis not present

## 2017-07-17 DIAGNOSIS — M1990 Unspecified osteoarthritis, unspecified site: Secondary | ICD-10-CM | POA: Diagnosis not present

## 2017-07-17 DIAGNOSIS — M9905 Segmental and somatic dysfunction of pelvic region: Secondary | ICD-10-CM | POA: Diagnosis not present

## 2017-07-17 DIAGNOSIS — Z1389 Encounter for screening for other disorder: Secondary | ICD-10-CM | POA: Diagnosis not present

## 2017-07-17 DIAGNOSIS — M5432 Sciatica, left side: Secondary | ICD-10-CM | POA: Diagnosis not present

## 2017-07-17 DIAGNOSIS — Z6828 Body mass index (BMI) 28.0-28.9, adult: Secondary | ICD-10-CM | POA: Diagnosis not present

## 2017-07-17 DIAGNOSIS — E7849 Other hyperlipidemia: Secondary | ICD-10-CM | POA: Diagnosis not present

## 2017-07-17 DIAGNOSIS — M545 Low back pain: Secondary | ICD-10-CM | POA: Diagnosis not present

## 2017-07-17 DIAGNOSIS — E119 Type 2 diabetes mellitus without complications: Secondary | ICD-10-CM | POA: Diagnosis not present

## 2017-07-17 DIAGNOSIS — M859 Disorder of bone density and structure, unspecified: Secondary | ICD-10-CM | POA: Diagnosis not present

## 2017-07-17 DIAGNOSIS — Z0000 Encounter for general adult medical examination without abnormal findings: Secondary | ICD-10-CM | POA: Diagnosis not present

## 2017-07-17 DIAGNOSIS — C50919 Malignant neoplasm of unspecified site of unspecified female breast: Secondary | ICD-10-CM | POA: Diagnosis not present

## 2017-07-19 DIAGNOSIS — M9905 Segmental and somatic dysfunction of pelvic region: Secondary | ICD-10-CM | POA: Diagnosis not present

## 2017-07-19 DIAGNOSIS — M9902 Segmental and somatic dysfunction of thoracic region: Secondary | ICD-10-CM | POA: Diagnosis not present

## 2017-07-19 DIAGNOSIS — M5136 Other intervertebral disc degeneration, lumbar region: Secondary | ICD-10-CM | POA: Diagnosis not present

## 2017-07-19 DIAGNOSIS — M9903 Segmental and somatic dysfunction of lumbar region: Secondary | ICD-10-CM | POA: Diagnosis not present

## 2017-07-19 DIAGNOSIS — M5135 Other intervertebral disc degeneration, thoracolumbar region: Secondary | ICD-10-CM | POA: Diagnosis not present

## 2017-07-19 DIAGNOSIS — M5432 Sciatica, left side: Secondary | ICD-10-CM | POA: Diagnosis not present

## 2017-07-21 DIAGNOSIS — M9903 Segmental and somatic dysfunction of lumbar region: Secondary | ICD-10-CM | POA: Diagnosis not present

## 2017-07-21 DIAGNOSIS — M5135 Other intervertebral disc degeneration, thoracolumbar region: Secondary | ICD-10-CM | POA: Diagnosis not present

## 2017-07-21 DIAGNOSIS — M5136 Other intervertebral disc degeneration, lumbar region: Secondary | ICD-10-CM | POA: Diagnosis not present

## 2017-07-21 DIAGNOSIS — M9905 Segmental and somatic dysfunction of pelvic region: Secondary | ICD-10-CM | POA: Diagnosis not present

## 2017-07-21 DIAGNOSIS — M5432 Sciatica, left side: Secondary | ICD-10-CM | POA: Diagnosis not present

## 2017-07-21 DIAGNOSIS — M9902 Segmental and somatic dysfunction of thoracic region: Secondary | ICD-10-CM | POA: Diagnosis not present

## 2017-07-21 DIAGNOSIS — Z1212 Encounter for screening for malignant neoplasm of rectum: Secondary | ICD-10-CM | POA: Diagnosis not present

## 2017-07-24 DIAGNOSIS — M25552 Pain in left hip: Secondary | ICD-10-CM | POA: Diagnosis not present

## 2017-07-24 DIAGNOSIS — M5416 Radiculopathy, lumbar region: Secondary | ICD-10-CM | POA: Diagnosis not present

## 2017-07-24 DIAGNOSIS — M5136 Other intervertebral disc degeneration, lumbar region: Secondary | ICD-10-CM | POA: Diagnosis not present

## 2017-07-24 DIAGNOSIS — M25561 Pain in right knee: Secondary | ICD-10-CM | POA: Diagnosis not present

## 2017-07-24 DIAGNOSIS — M546 Pain in thoracic spine: Secondary | ICD-10-CM | POA: Diagnosis not present

## 2017-07-24 DIAGNOSIS — M545 Low back pain: Secondary | ICD-10-CM | POA: Diagnosis not present

## 2017-07-25 DIAGNOSIS — M5416 Radiculopathy, lumbar region: Secondary | ICD-10-CM | POA: Diagnosis not present

## 2017-07-25 DIAGNOSIS — M545 Low back pain: Secondary | ICD-10-CM | POA: Diagnosis not present

## 2017-07-25 DIAGNOSIS — M5136 Other intervertebral disc degeneration, lumbar region: Secondary | ICD-10-CM | POA: Diagnosis not present

## 2017-07-25 DIAGNOSIS — M546 Pain in thoracic spine: Secondary | ICD-10-CM | POA: Diagnosis not present

## 2017-07-25 DIAGNOSIS — M25552 Pain in left hip: Secondary | ICD-10-CM | POA: Diagnosis not present

## 2017-07-25 DIAGNOSIS — M25561 Pain in right knee: Secondary | ICD-10-CM | POA: Diagnosis not present

## 2017-07-28 DIAGNOSIS — M545 Low back pain: Secondary | ICD-10-CM | POA: Diagnosis not present

## 2017-07-28 DIAGNOSIS — M5416 Radiculopathy, lumbar region: Secondary | ICD-10-CM | POA: Diagnosis not present

## 2017-07-28 DIAGNOSIS — M5136 Other intervertebral disc degeneration, lumbar region: Secondary | ICD-10-CM | POA: Diagnosis not present

## 2017-07-28 DIAGNOSIS — M546 Pain in thoracic spine: Secondary | ICD-10-CM | POA: Diagnosis not present

## 2017-07-28 DIAGNOSIS — M25561 Pain in right knee: Secondary | ICD-10-CM | POA: Diagnosis not present

## 2017-07-28 DIAGNOSIS — M25552 Pain in left hip: Secondary | ICD-10-CM | POA: Diagnosis not present

## 2017-07-31 DIAGNOSIS — M25562 Pain in left knee: Secondary | ICD-10-CM | POA: Diagnosis not present

## 2017-07-31 DIAGNOSIS — M545 Low back pain: Secondary | ICD-10-CM | POA: Diagnosis not present

## 2017-08-01 DIAGNOSIS — M545 Low back pain: Secondary | ICD-10-CM | POA: Diagnosis not present

## 2017-08-01 DIAGNOSIS — M5416 Radiculopathy, lumbar region: Secondary | ICD-10-CM | POA: Diagnosis not present

## 2017-08-04 DIAGNOSIS — M5416 Radiculopathy, lumbar region: Secondary | ICD-10-CM | POA: Diagnosis not present

## 2017-08-04 DIAGNOSIS — M545 Low back pain: Secondary | ICD-10-CM | POA: Diagnosis not present

## 2017-08-06 DIAGNOSIS — M545 Low back pain: Secondary | ICD-10-CM | POA: Diagnosis not present

## 2017-08-06 DIAGNOSIS — M5416 Radiculopathy, lumbar region: Secondary | ICD-10-CM | POA: Diagnosis not present

## 2017-08-07 DIAGNOSIS — M545 Low back pain: Secondary | ICD-10-CM | POA: Diagnosis not present

## 2017-08-12 DIAGNOSIS — M5416 Radiculopathy, lumbar region: Secondary | ICD-10-CM | POA: Diagnosis not present

## 2017-08-12 DIAGNOSIS — M545 Low back pain: Secondary | ICD-10-CM | POA: Diagnosis not present

## 2017-08-18 DIAGNOSIS — M5416 Radiculopathy, lumbar region: Secondary | ICD-10-CM | POA: Diagnosis not present

## 2017-08-21 ENCOUNTER — Other Ambulatory Visit: Payer: Self-pay | Admitting: Orthopedic Surgery

## 2017-08-27 DIAGNOSIS — H2513 Age-related nuclear cataract, bilateral: Secondary | ICD-10-CM | POA: Diagnosis not present

## 2017-08-27 DIAGNOSIS — H401121 Primary open-angle glaucoma, left eye, mild stage: Secondary | ICD-10-CM | POA: Diagnosis not present

## 2017-08-27 DIAGNOSIS — E119 Type 2 diabetes mellitus without complications: Secondary | ICD-10-CM | POA: Diagnosis not present

## 2017-08-27 DIAGNOSIS — H5000 Unspecified esotropia: Secondary | ICD-10-CM | POA: Diagnosis not present

## 2017-08-27 DIAGNOSIS — H401112 Primary open-angle glaucoma, right eye, moderate stage: Secondary | ICD-10-CM | POA: Diagnosis not present

## 2017-08-29 NOTE — Pre-Procedure Instructions (Signed)
SHAMEL GALYEAN  08/29/2017      [place] [address], [place] [address] [place] [address] Phone: [phone] Fax: [phone]    Your procedure is scheduled on 09-04-2017 Thursday .  Report to [facility] Admitting at 5:30 A.M.   Call this number if you have problems the morning of surgery:  [phone]   Remember:  Do not eat food or drink liquids after midnight.   Take these medicines the morning of surgery with A SIP OF WATER   Tylenol if needed  Tizanidine(Zanaflex) if needed  STOP TAKING ANY ASPIRIN(UNLESS OTHERWISE INSTRUCTED BY YOUR SURGEON),ANTIINFLAMATORIES (IBUPROFEN,ALEVE,MOTRIN,ADVIL,GOODY'S POWDERS),HERBAL SUPPLEMENTS,FISH OIL,AND VITAMINS 5-7 DAYS PRIOR TO SURGERY      How to Manage Your Diabetes Before and After Surgery  Why is it important to control my blood sugar before and after surgery? . Improving blood sugar levels before and after surgery helps healing and can limit problems. . A way of improving blood sugar control is eating a healthy diet by: o  Eating less sugar and carbohydrates o  Increasing activity/exercise o  Talking with your doctor about reaching your blood sugar goals . High blood sugars (greater than 180 mg/dL) can raise your risk of infections and slow your recovery, so you will need to focus on controlling your diabetes during the weeks before surgery. . Make sure that the doctor who takes care of your diabetes knows about your planned surgery including the date and location.  How do I manage my blood sugar before surgery? . Check your blood sugar at least 4 times a day, starting 2 days before surgery, to make sure that the level is not too high or low. o Check your blood sugar the morning of your surgery when you wake up and every 2 hours until you get to the Short Stay unit. . If your blood sugar is less than 70 mg/dL, you will need to treat for low blood  sugar: o Do not take insulin. o Treat a low blood sugar (less than 70 mg/dL) with  cup of clear juice (cranberry or apple), 4 glucose tablets, OR glucose gel. Recheck blood sugar in 15 minutes after treatment (to make sure it is greater than 70 mg/dL). If your blood sugar is not greater than 70 mg/dL on recheck, call [phone] o  for further instructions. . Report your blood sugar to the short stay nurse when you get to Short Stay.  . If you are admitted to the hospital after surgery: o Your blood sugar will be checked by the staff and you will probably be given insulin after surgery (instead of oral diabetes medicines) to make sure you have good blood sugar levels. o The goal for blood sugar control after surgery is 80-180 mg/dL.              WHAT DO I DO ABOUT MY DIABETES MEDICATION?   Marland Kitchen Do not take oral diabetes medicines (pills) the morning of surgery.Metformin  . .       . .  .   Other Instructions:            Do not wear lotions, powders, or perfumes, or deodorant.  Do not shave 48 hours prior to surgery.  Men may shave face and neck.  Do not bring valuables to the hospital.  [facility] is not responsible for any belongings or valuables.  Contacts, dentures or bridgework may not  be worn into surgery.  Leave your suitcase in the car.  After surgery it may be brought to your room.  For patients admitted to the hospital, discharge time will be determined by your treatment team.  Patients discharged the day of surgery will not be allowed to drive home.    Special Instructions: [facility] - Preparing for Surgery  Before surgery, you can play an important role.  Because skin is not sterile, your skin needs to be as free of germs as possible.  You can reduce the number of germs on you skin by washing with CHG (chlorahexidine gluconate) soap before surgery.  CHG is an antiseptic cleaner which kills germs and bonds with the skin to continue killing germs  even after washing.  Please DO NOT use if you have an allergy to CHG or antibacterial soaps.  If your skin becomes reddened/irritated stop using the CHG and inform your nurse when you arrive at Short Stay.  Do not shave (including legs and underarms) for at least 48 hours prior to the first CHG shower.  You may shave your face.  Please follow these instructions carefully:   1.  Shower with CHG Soap the night before surgery and the   morning of Surgery.  2.  If you choose to wash your hair, wash your hair first as usual with your normal shampoo.  3.  After you shampoo, rinse your hair and body thoroughly to remove the  Shampoo.  4.  Use CHG as you would any other liquid soap.  You can apply chg directly  to the skin and wash gently with scrungie or a clean washcloth.  5.  Apply the CHG Soap to your body ONLY FROM THE NECK DOWN.   Do not use on open wounds or open sores.  Avoid contact with your eyes,  ears, mouth and genitals (private parts).  Wash genitals (private parts) with your normal soap.  6.  Wash thoroughly, paying special attention to the area where your surgery will be performed.  7.  Thoroughly rinse your body with warm water from the neck down.  8.  DO NOT shower/wash with your normal soap after using and rinsing o  the CHG Soap.  9.  Pat yourself dry with a clean towel.            10.  Wear clean pajamas.            11.  Place clean sheets on your bed the night of your first shower and do not sleep with pets.  Day of Surgery  Do not apply any lotions/deodorants the morning of surgery.  Please wear clean clothes to the hospital/surgery center.   Please read over the following fact sheets that you were given. Pain Booklet, MRSA Information and Surgical Site Infection Prevention

## 2017-09-01 ENCOUNTER — Encounter (HOSPITAL_COMMUNITY)
Admission: RE | Admit: 2017-09-01 | Discharge: 2017-09-01 | Disposition: A | Discharge status: Home / Self Care | Payer: Medicare Other | Source: Ambulatory Visit | Attending: Orthopedic Surgery | Admitting: Orthopedic Surgery

## 2017-09-01 ENCOUNTER — Other Ambulatory Visit: Payer: Self-pay

## 2017-09-01 ENCOUNTER — Encounter (HOSPITAL_COMMUNITY): Payer: Self-pay

## 2017-09-01 DIAGNOSIS — Z01812 Encounter for preprocedural laboratory examination: Secondary | ICD-10-CM

## 2017-09-01 DIAGNOSIS — Z79810 Long term (current) use of selective estrogen receptor modulators (SERMs): Secondary | ICD-10-CM | POA: Diagnosis not present

## 2017-09-01 DIAGNOSIS — C50912 Malignant neoplasm of unspecified site of left female breast: Secondary | ICD-10-CM | POA: Diagnosis not present

## 2017-09-01 DIAGNOSIS — M48061 Spinal stenosis, lumbar region without neurogenic claudication: Secondary | ICD-10-CM | POA: Diagnosis not present

## 2017-09-01 DIAGNOSIS — E119 Type 2 diabetes mellitus without complications: Secondary | ICD-10-CM

## 2017-09-01 DIAGNOSIS — Z9012 Acquired absence of left breast and nipple: Secondary | ICD-10-CM | POA: Diagnosis not present

## 2017-09-01 DIAGNOSIS — E785 Hyperlipidemia, unspecified: Secondary | ICD-10-CM | POA: Diagnosis not present

## 2017-09-01 DIAGNOSIS — Z7984 Long term (current) use of oral hypoglycemic drugs: Secondary | ICD-10-CM | POA: Diagnosis not present

## 2017-09-01 DIAGNOSIS — Z79899 Other long term (current) drug therapy: Secondary | ICD-10-CM | POA: Diagnosis not present

## 2017-09-01 DIAGNOSIS — M5416 Radiculopathy, lumbar region: Secondary | ICD-10-CM | POA: Diagnosis not present

## 2017-09-01 DIAGNOSIS — M4316 Spondylolisthesis, lumbar region: Secondary | ICD-10-CM | POA: Diagnosis not present

## 2017-09-01 DIAGNOSIS — Z01810 Encounter for preprocedural cardiovascular examination: Secondary | ICD-10-CM

## 2017-09-01 HISTORY — DX: Hypotony of right eye due to ocular fistula: H44.421

## 2017-09-01 LAB — CBC WITH DIFFERENTIAL/PLATELET
Basophils Absolute: 0 10*3/uL (ref 0.0–0.1)
Basophils Relative: 1 %
EOS ABS: 0.1 10*3/uL (ref 0.0–0.7)
Eosinophils Relative: 2 %
HCT: 41 % (ref 36.0–46.0)
HEMOGLOBIN: 13.3 g/dL (ref 12.0–15.0)
LYMPHS ABS: 2.1 10*3/uL (ref 0.7–4.0)
Lymphocytes Relative: 35 %
MCH: 30.7 pg (ref 26.0–34.0)
MCHC: 32.4 g/dL (ref 30.0–36.0)
MCV: 94.7 fL (ref 78.0–100.0)
Monocytes Absolute: 0.5 10*3/uL (ref 0.1–1.0)
Monocytes Relative: 8 %
NEUTROS PCT: 54 %
Neutro Abs: 3.3 10*3/uL (ref 1.7–7.7)
Platelets: 225 10*3/uL (ref 150–400)
RBC: 4.33 MIL/uL (ref 3.87–5.11)
RDW: 14.6 % (ref 11.5–15.5)
WBC: 6 10*3/uL (ref 4.0–10.5)

## 2017-09-01 LAB — URINALYSIS, ROUTINE W REFLEX MICROSCOPIC
Bilirubin Urine: NEGATIVE
Glucose, UA: NEGATIVE mg/dL
Hgb urine dipstick: NEGATIVE
KETONES UR: NEGATIVE mg/dL
Nitrite: NEGATIVE
Protein, ur: NEGATIVE mg/dL
Specific Gravity, Urine: 1.019 (ref 1.005–1.030)
pH: 5 (ref 5.0–8.0)

## 2017-09-01 LAB — COMPREHENSIVE METABOLIC PANEL
ALBUMIN: 4.1 g/dL (ref 3.5–5.0)
ALK PHOS: 42 U/L (ref 38–126)
ALT: 16 U/L (ref 14–54)
AST: 20 U/L (ref 15–41)
Anion gap: 10 (ref 5–15)
BUN: 16 mg/dL (ref 6–20)
CALCIUM: 10.6 mg/dL — AB (ref 8.9–10.3)
CO2: 22 mmol/L (ref 22–32)
CREATININE: 0.67 mg/dL (ref 0.44–1.00)
Chloride: 107 mmol/L (ref 101–111)
GFR calc non Af Amer: >60 mL/min (ref >60)
GLUCOSE: 117 mg/dL — AB (ref 65–99)
Potassium: 3.7 mmol/L (ref 3.5–5.1)
SODIUM: 139 mmol/L (ref 135–145)
Total Bilirubin: 1 mg/dL (ref 0.3–1.2)
Total Protein: 6.8 g/dL (ref 6.5–8.1)

## 2017-09-01 LAB — HEMOGLOBIN A1C
HEMOGLOBIN A1C: 5.9 % — AB (ref 4.8–5.6)
Mean Plasma Glucose: 122.63 mg/dL

## 2017-09-01 LAB — TYPE AND SCREEN
ABO/RH(D): A POS
Antibody Screen: NEGATIVE

## 2017-09-01 LAB — PROTIME-INR
INR: 1
Prothrombin Time: 13.1 seconds (ref 11.4–15.2)

## 2017-09-01 LAB — GLUCOSE, CAPILLARY: Glucose-Capillary: 102 mg/dL — ABNORMAL HIGH (ref 65–99)

## 2017-09-01 LAB — SURGICAL PCR SCREEN
MRSA, PCR: NEGATIVE
Staphylococcus aureus: NEGATIVE

## 2017-09-01 LAB — APTT: aPTT: 29 seconds (ref 24–36)

## 2017-09-01 LAB — ABO/RH: ABO/RH(D): A POS

## 2017-09-01 NOTE — Progress Notes (Signed)
Dr Dumonski's office called and informed of Ua results. Spoke with Butch Penny.

## 2017-09-01 NOTE — Progress Notes (Signed)
PCP is Dr. Shon Baton Denies seeing a cardiologist. Denies chest pain, cough, or fever. Denies ever having a card cath, stress test, or echo. States that she doesn't check her CBG's Reports in Jan her A1c was 6.0

## 2017-09-03 NOTE — Anesthesia Preprocedure Evaluation (Addendum)
Anesthesia Evaluation  Patient identified by MRN, date of birth, ID band Patient awake    Reviewed: Allergy & Precautions, NPO status , Patient's Chart, lab work & pertinent test results  Airway Mallampati: II  TM Distance: >3 FB Neck ROM: Full    Dental  (+) Dental Advisory Given, Teeth Intact   Pulmonary neg pulmonary ROS,    Pulmonary exam normal breath sounds clear to auscultation       Cardiovascular Exercise Tolerance: Good negative cardio ROS Normal cardiovascular exam Rhythm:Regular Rate:Normal     Neuro/Psych negative neurological ROS  negative psych ROS   GI/Hepatic negative GI ROS, Neg liver ROS,   Endo/Other  diabetes, Type 2, Oral Hypoglycemic Agents  Renal/GU negative Renal ROS  negative genitourinary   Musculoskeletal negative musculoskeletal ROS (+)   Abdominal   Peds  Hematology negative hematology ROS (+)   Anesthesia Other Findings Breast cancer  Reproductive/Obstetrics                            Anesthesia Physical Anesthesia Plan  ASA: II  Anesthesia Plan: General   Post-op Pain Management:    Induction: Intravenous  PONV Risk Score and Plan: 3 and Treatment may vary due to age or medical condition, Ondansetron, Scopolamine patch - Pre-op and Diphenhydramine  Airway Management Planned: Oral ETT  Additional Equipment: None  Intra-op Plan:   Post-operative Plan: Extubation in OR  Informed Consent: I have reviewed the patients History and Physical, chart, labs and discussed the procedure including the risks, benefits and alternatives for the proposed anesthesia with the patient or authorized representative who has indicated his/her understanding and acceptance.   Dental advisory given  Plan Discussed with: CRNA and Anesthesiologist  Anesthesia Plan Comments: (Will avoid decadron per surgeon preference. Low dose benadryl for antiemesis towards end of  case.)        Anesthesia Quick Evaluation

## 2017-09-04 ENCOUNTER — Encounter (HOSPITAL_COMMUNITY)
Admission: RE | Disposition: A | Discharge status: Home / Self Care | Payer: Self-pay | Source: Ambulatory Visit | Attending: Orthopedic Surgery

## 2017-09-04 ENCOUNTER — Encounter (HOSPITAL_COMMUNITY): Payer: Self-pay | Admitting: General Practice

## 2017-09-04 ENCOUNTER — Inpatient Hospital Stay (HOSPITAL_COMMUNITY): Payer: Medicare Other

## 2017-09-04 ENCOUNTER — Inpatient Hospital Stay (HOSPITAL_COMMUNITY)
Admission: RE | Admit: 2017-09-04 | Discharge: 2017-09-05 | DRG: 455 | Disposition: A | Discharge status: Home / Self Care | Payer: Medicare Other | Source: Ambulatory Visit | Attending: Orthopedic Surgery | Admitting: Orthopedic Surgery

## 2017-09-04 ENCOUNTER — Inpatient Hospital Stay (HOSPITAL_COMMUNITY): Payer: Medicare Other | Admitting: Anesthesiology

## 2017-09-04 ENCOUNTER — Other Ambulatory Visit: Payer: Self-pay

## 2017-09-04 DIAGNOSIS — C50912 Malignant neoplasm of unspecified site of left female breast: Secondary | ICD-10-CM | POA: Diagnosis present

## 2017-09-04 DIAGNOSIS — E785 Hyperlipidemia, unspecified: Secondary | ICD-10-CM | POA: Diagnosis present

## 2017-09-04 DIAGNOSIS — Z7984 Long term (current) use of oral hypoglycemic drugs: Secondary | ICD-10-CM | POA: Diagnosis not present

## 2017-09-04 DIAGNOSIS — M5416 Radiculopathy, lumbar region: Secondary | ICD-10-CM | POA: Diagnosis present

## 2017-09-04 DIAGNOSIS — Z79810 Long term (current) use of selective estrogen receptor modulators (SERMs): Secondary | ICD-10-CM

## 2017-09-04 DIAGNOSIS — Z981 Arthrodesis status: Secondary | ICD-10-CM | POA: Diagnosis not present

## 2017-09-04 DIAGNOSIS — M4326 Fusion of spine, lumbar region: Secondary | ICD-10-CM | POA: Diagnosis not present

## 2017-09-04 DIAGNOSIS — M48061 Spinal stenosis, lumbar region without neurogenic claudication: Principal | ICD-10-CM | POA: Diagnosis present

## 2017-09-04 DIAGNOSIS — M4316 Spondylolisthesis, lumbar region: Secondary | ICD-10-CM | POA: Diagnosis present

## 2017-09-04 DIAGNOSIS — E119 Type 2 diabetes mellitus without complications: Secondary | ICD-10-CM | POA: Diagnosis present

## 2017-09-04 DIAGNOSIS — Z79899 Other long term (current) drug therapy: Secondary | ICD-10-CM

## 2017-09-04 DIAGNOSIS — Z9012 Acquired absence of left breast and nipple: Secondary | ICD-10-CM

## 2017-09-04 DIAGNOSIS — Z419 Encounter for procedure for purposes other than remedying health state, unspecified: Secondary | ICD-10-CM

## 2017-09-04 DIAGNOSIS — M79605 Pain in left leg: Secondary | ICD-10-CM | POA: Diagnosis not present

## 2017-09-04 DIAGNOSIS — M5410 Radiculopathy, site unspecified: Secondary | ICD-10-CM | POA: Diagnosis present

## 2017-09-04 DIAGNOSIS — C50812 Malignant neoplasm of overlapping sites of left female breast: Secondary | ICD-10-CM | POA: Diagnosis not present

## 2017-09-04 LAB — GLUCOSE, CAPILLARY
GLUCOSE-CAPILLARY: 103 mg/dL — AB (ref 65–99)
Glucose-Capillary: 100 mg/dL — ABNORMAL HIGH (ref 65–99)
Glucose-Capillary: 123 mg/dL — ABNORMAL HIGH (ref 65–99)
Glucose-Capillary: 82 mg/dL (ref 65–99)
Glucose-Capillary: 158 mg/dL — ABNORMAL HIGH (ref 65–99)

## 2017-09-04 SURGERY — POSTERIOR LUMBAR FUSION 1 LEVEL
Anesthesia: General | Site: Back | Laterality: Left

## 2017-09-04 MED ORDER — PHENYLEPHRINE HCL 10 MG/ML IJ SOLN
INTRAVENOUS | Status: DC | PRN
  Administered 2017-09-04: 15 ug/min via INTRAVENOUS

## 2017-09-04 MED ORDER — METHYLENE BLUE 0.5 % INJ SOLN
INTRAVENOUS | Status: DC | PRN
  Administered 2017-09-04: 1 mL

## 2017-09-04 MED ORDER — ONDANSETRON HCL 4 MG/2ML IJ SOLN
INTRAMUSCULAR | Status: DC | PRN
  Administered 2017-09-04: 4 mg via INTRAVENOUS

## 2017-09-04 MED ORDER — LATANOPROST 0.005 % OP SOLN
1.0000 [drp] | Freq: Every day | OPHTHALMIC | Status: DC
  Administered 2017-09-04: 1 [drp] via OPHTHALMIC
  Filled 2017-09-04: qty 2.5

## 2017-09-04 MED ORDER — ONDANSETRON HCL 4 MG/2ML IJ SOLN: 4.0000 mg | Freq: Four times a day (QID) | INTRAMUSCULAR | Status: DC | PRN

## 2017-09-04 MED ORDER — FENTANYL CITRATE (PF) 100 MCG/2ML IJ SOLN
25.0000 ug | INTRAMUSCULAR | Status: DC | PRN
  Administered 2017-09-04 (×2): 25 ug via INTRAVENOUS
  Administered 2017-09-04: 50 ug via INTRAVENOUS

## 2017-09-04 MED ORDER — PANTOPRAZOLE SODIUM 40 MG IV SOLR: 40.0000 mg | Freq: Every day | INTRAVENOUS | Status: DC

## 2017-09-04 MED ORDER — ONDANSETRON HCL 4 MG/2ML IJ SOLN: 4.0000 mg | Freq: Once | INTRAMUSCULAR | Status: DC | PRN

## 2017-09-04 MED ORDER — ACETAMINOPHEN 325 MG PO TABS
650.0000 mg | ORAL_TABLET | ORAL | Status: DC | PRN
  Administered 2017-09-05: 650 mg via ORAL
  Filled 2017-09-04: qty 2

## 2017-09-04 MED ORDER — CEFAZOLIN SODIUM-DEXTROSE 2-4 GM/100ML-% IV SOLN
2.0000 g | Freq: Three times a day (TID) | INTRAVENOUS | Status: AC
  Administered 2017-09-04 – 2017-09-05 (×2): 2 g via INTRAVENOUS
  Filled 2017-09-04 (×2): qty 100

## 2017-09-04 MED ORDER — DIAZEPAM 5 MG PO TABS
5.0000 mg | ORAL_TABLET | Freq: Four times a day (QID) | ORAL | Status: DC | PRN
  Administered 2017-09-04 – 2017-09-05 (×3): 5 mg via ORAL
  Filled 2017-09-04 (×3): qty 1

## 2017-09-04 MED ORDER — CHLORHEXIDINE GLUCONATE 4 % EX LIQD: 60.0000 mL | Freq: Once | CUTANEOUS | Status: DC

## 2017-09-04 MED ORDER — METFORMIN HCL 500 MG PO TABS
1000.0000 mg | ORAL_TABLET | Freq: Every day | ORAL | Status: DC
  Administered 2017-09-04 – 2017-09-05 (×2): 1000 mg via ORAL
  Filled 2017-09-04 (×2): qty 2

## 2017-09-04 MED ORDER — FLEET ENEMA 7-19 GM/118ML RE ENEM: 1.0000 | ENEMA | Freq: Once | RECTAL | Status: DC | PRN

## 2017-09-04 MED ORDER — MENTHOL 3 MG MT LOZG: 1.0000 | LOZENGE | OROMUCOSAL | Status: DC | PRN

## 2017-09-04 MED ORDER — FENTANYL CITRATE (PF) 250 MCG/5ML IJ SOLN
INTRAMUSCULAR | Status: DC | PRN
  Administered 2017-09-04: 100 ug via INTRAVENOUS
  Administered 2017-09-04: 25 ug via INTRAVENOUS
  Administered 2017-09-04: 50 ug via INTRAVENOUS
  Administered 2017-09-04 (×2): 25 ug via INTRAVENOUS
  Administered 2017-09-04: 50 ug via INTRAVENOUS
  Administered 2017-09-04: 25 ug via INTRAVENOUS
  Administered 2017-09-04: 50 ug via INTRAVENOUS

## 2017-09-04 MED ORDER — SODIUM CHLORIDE 0.9% FLUSH: 3.0000 mL | INTRAVENOUS | Status: DC | PRN

## 2017-09-04 MED ORDER — SURGIFOAM 100 EX MISC
CUTANEOUS | Status: DC | PRN
  Administered 2017-09-04: 09:00:00 via TOPICAL

## 2017-09-04 MED ORDER — LACTATED RINGERS IV SOLN
INTRAVENOUS | Status: DC | PRN
  Administered 2017-09-04 (×2): via INTRAVENOUS

## 2017-09-04 MED ORDER — PROPOFOL 10 MG/ML IV BOLUS
INTRAVENOUS | Status: AC
  Filled 2017-09-04: qty 20

## 2017-09-04 MED ORDER — PROPOFOL 10 MG/ML IV BOLUS
INTRAVENOUS | Status: DC | PRN
Start: 2017-09-04 — End: 2017-09-04
  Administered 2017-09-04: 170 mg via INTRAVENOUS

## 2017-09-04 MED ORDER — PHENOL 1.4 % MT LIQD: 1.0000 | OROMUCOSAL | Status: DC | PRN

## 2017-09-04 MED ORDER — 0.9 % SODIUM CHLORIDE (POUR BTL) OPTIME
TOPICAL | Status: DC | PRN
  Administered 2017-09-04 (×3): 1000 mL

## 2017-09-04 MED ORDER — TAMOXIFEN CITRATE 20 MG PO TABS: 20.0000 mg | ORAL_TABLET | Freq: Every evening | ORAL | Status: DC

## 2017-09-04 MED ORDER — ROCURONIUM BROMIDE 100 MG/10ML IV SOLN
INTRAVENOUS | Status: DC | PRN
  Administered 2017-09-04: 10 mg via INTRAVENOUS
  Administered 2017-09-04: 30 mg via INTRAVENOUS
  Administered 2017-09-04: 10 mg via INTRAVENOUS
  Administered 2017-09-04: 40 mg via INTRAVENOUS
  Administered 2017-09-04: 10 mg via INTRAVENOUS

## 2017-09-04 MED ORDER — OXYCODONE HCL 5 MG/5ML PO SOLN: 5.0000 mg | Freq: Once | ORAL | Status: DC | PRN

## 2017-09-04 MED ORDER — SODIUM CHLORIDE 0.9% FLUSH
3.0000 mL | Freq: Two times a day (BID) | INTRAVENOUS | Status: DC
  Administered 2017-09-04: 3 mL via INTRAVENOUS

## 2017-09-04 MED ORDER — ALUM & MAG HYDROXIDE-SIMETH 200-200-20 MG/5ML PO SUSP: 30.0000 mL | Freq: Four times a day (QID) | ORAL | Status: DC | PRN

## 2017-09-04 MED ORDER — DEXAMETHASONE SODIUM PHOSPHATE 10 MG/ML IJ SOLN
INTRAMUSCULAR | Status: AC
  Filled 2017-09-04: qty 1

## 2017-09-04 MED ORDER — OXYCODONE HCL 5 MG PO TABS: 5.0000 mg | ORAL_TABLET | Freq: Once | ORAL | Status: DC | PRN

## 2017-09-04 MED ORDER — MORPHINE SULFATE (PF) 4 MG/ML IV SOLN
1.0000 mg | INTRAVENOUS | Status: DC | PRN
  Administered 2017-09-04: 2 mg via INTRAVENOUS
  Filled 2017-09-04: qty 1

## 2017-09-04 MED ORDER — CEFAZOLIN SODIUM-DEXTROSE 2-4 GM/100ML-% IV SOLN: 2.0000 g | Freq: Three times a day (TID) | INTRAVENOUS | Status: DC

## 2017-09-04 MED ORDER — ONDANSETRON HCL 4 MG PO TABS: 4.0000 mg | ORAL_TABLET | Freq: Four times a day (QID) | ORAL | Status: DC | PRN

## 2017-09-04 MED ORDER — POTASSIUM CHLORIDE IN NACL 20-0.9 MEQ/L-% IV SOLN: INTRAVENOUS | Status: DC

## 2017-09-04 MED ORDER — ADULT MULTIVITAMIN W/MINERALS CH
1.0000 | ORAL_TABLET | Freq: Every day | ORAL | Status: DC
  Administered 2017-09-04 – 2017-09-05 (×2): 1 via ORAL
  Filled 2017-09-04 (×2): qty 1

## 2017-09-04 MED ORDER — PANTOPRAZOLE SODIUM 40 MG PO TBEC
40.0000 mg | DELAYED_RELEASE_TABLET | Freq: Every day | ORAL | Status: DC
  Administered 2017-09-04: 40 mg via ORAL
  Filled 2017-09-04: qty 1

## 2017-09-04 MED ORDER — B COMPLEX-C PO TABS
1.0000 | ORAL_TABLET | Freq: Every day | ORAL | Status: DC
  Administered 2017-09-04 – 2017-09-05 (×2): 1 via ORAL
  Filled 2017-09-04 (×2): qty 1

## 2017-09-04 MED ORDER — FENTANYL CITRATE (PF) 100 MCG/2ML IJ SOLN
INTRAMUSCULAR | Status: AC
  Filled 2017-09-04: qty 2

## 2017-09-04 MED ORDER — ACETAMINOPHEN 650 MG RE SUPP: 650.0000 mg | RECTAL | Status: DC | PRN

## 2017-09-04 MED ORDER — BISACODYL 5 MG PO TBEC: 5.0000 mg | DELAYED_RELEASE_TABLET | Freq: Every day | ORAL | Status: DC | PRN

## 2017-09-04 MED ORDER — METHYLENE BLUE 0.5 % INJ SOLN
INTRAVENOUS | Status: AC
  Filled 2017-09-04: qty 10

## 2017-09-04 MED ORDER — EPHEDRINE SULFATE 50 MG/ML IJ SOLN
INTRAMUSCULAR | Status: DC | PRN
  Administered 2017-09-04: 5 mg via INTRAVENOUS

## 2017-09-04 MED ORDER — OXYCODONE-ACETAMINOPHEN 5-325 MG PO TABS
1.0000 | ORAL_TABLET | ORAL | Status: DC | PRN
  Administered 2017-09-04 – 2017-09-05 (×5): 2 via ORAL
  Filled 2017-09-04 (×5): qty 2

## 2017-09-04 MED ORDER — SENNOSIDES-DOCUSATE SODIUM 8.6-50 MG PO TABS
1.0000 | ORAL_TABLET | Freq: Every evening | ORAL | Status: DC | PRN
  Filled 2017-09-04: qty 1

## 2017-09-04 MED ORDER — ONDANSETRON HCL 4 MG/2ML IJ SOLN
4.0000 mg | Freq: Four times a day (QID) | INTRAMUSCULAR | Status: DC | PRN
  Administered 2017-09-04: 4 mg via INTRAVENOUS
  Filled 2017-09-04: qty 2

## 2017-09-04 MED ORDER — BUPIVACAINE-EPINEPHRINE 0.25% -1:200000 IJ SOLN
INTRAMUSCULAR | Status: DC | PRN
  Administered 2017-09-04: 7 mL
  Administered 2017-09-04: 20 mL

## 2017-09-04 MED ORDER — LIDOCAINE HCL (CARDIAC) 20 MG/ML IV SOLN
INTRAVENOUS | Status: DC | PRN
  Administered 2017-09-04: 50 mg via INTRAVENOUS

## 2017-09-04 MED ORDER — ROSUVASTATIN CALCIUM 5 MG PO TABS
10.0000 mg | ORAL_TABLET | Freq: Every evening | ORAL | Status: DC
  Administered 2017-09-04: 10 mg via ORAL
  Filled 2017-09-04: qty 2

## 2017-09-04 MED ORDER — EPHEDRINE 5 MG/ML INJ
INTRAVENOUS | Status: AC
  Filled 2017-09-04: qty 10

## 2017-09-04 MED ORDER — SUGAMMADEX SODIUM 200 MG/2ML IV SOLN
INTRAVENOUS | Status: AC
  Filled 2017-09-04: qty 2

## 2017-09-04 MED ORDER — DOCUSATE SODIUM 100 MG PO CAPS
100.0000 mg | ORAL_CAPSULE | Freq: Two times a day (BID) | ORAL | Status: DC
  Administered 2017-09-04 – 2017-09-05 (×3): 100 mg via ORAL
  Filled 2017-09-04 (×3): qty 1

## 2017-09-04 MED ORDER — BUPIVACAINE-EPINEPHRINE (PF) 0.25% -1:200000 IJ SOLN
INTRAMUSCULAR | Status: AC
  Filled 2017-09-04: qty 30

## 2017-09-04 MED ORDER — ZOLPIDEM TARTRATE 5 MG PO TABS: 5.0000 mg | ORAL_TABLET | Freq: Every evening | ORAL | Status: DC | PRN

## 2017-09-04 MED ORDER — FENTANYL CITRATE (PF) 250 MCG/5ML IJ SOLN
INTRAMUSCULAR | Status: AC
  Filled 2017-09-04: qty 5

## 2017-09-04 MED ORDER — ROCURONIUM BROMIDE 10 MG/ML (PF) SYRINGE
PREFILLED_SYRINGE | INTRAVENOUS | Status: AC
  Filled 2017-09-04: qty 5

## 2017-09-04 MED ORDER — ONDANSETRON HCL 4 MG/2ML IJ SOLN
INTRAMUSCULAR | Status: AC
  Filled 2017-09-04: qty 2

## 2017-09-04 MED ORDER — SUPER B COMPLEX/C PO CAPS: ORAL_CAPSULE | Freq: Every day | ORAL | Status: DC

## 2017-09-04 MED ORDER — TAMOXIFEN CITRATE 10 MG PO TABS
20.0000 mg | ORAL_TABLET | Freq: Every evening | ORAL | Status: DC
  Administered 2017-09-04: 20 mg via ORAL
  Filled 2017-09-04: qty 2

## 2017-09-04 MED ORDER — PHENYLEPHRINE 40 MCG/ML (10ML) SYRINGE FOR IV PUSH (FOR BLOOD PRESSURE SUPPORT)
PREFILLED_SYRINGE | INTRAVENOUS | Status: AC
  Filled 2017-09-04: qty 10

## 2017-09-04 MED ORDER — BUPIVACAINE LIPOSOME 1.3 % IJ SUSP
20.0000 mL | INTRAMUSCULAR | Status: AC
  Administered 2017-09-04: 20 mL
  Filled 2017-09-04: qty 20

## 2017-09-04 MED ORDER — SUGAMMADEX SODIUM 200 MG/2ML IV SOLN
INTRAVENOUS | Status: DC | PRN
  Administered 2017-09-04 (×2): 50 mg via INTRAVENOUS

## 2017-09-04 MED ORDER — THROMBIN (RECOMBINANT) 20000 UNITS EX SOLR
CUTANEOUS | Status: AC
  Filled 2017-09-04: qty 20000

## 2017-09-04 MED ORDER — LACTATED RINGERS IV SOLN
INTRAVENOUS | Status: DC | PRN
  Administered 2017-09-04: 07:00:00 via INTRAVENOUS

## 2017-09-04 MED ORDER — CEFAZOLIN SODIUM-DEXTROSE 2-4 GM/100ML-% IV SOLN
2.0000 g | INTRAVENOUS | Status: AC
  Administered 2017-09-04: 2 g via INTRAVENOUS
  Filled 2017-09-04: qty 100

## 2017-09-04 MED ORDER — PROPOFOL 500 MG/50ML IV EMUL
INTRAVENOUS | Status: DC | PRN
  Administered 2017-09-04: 25 ug/kg/min via INTRAVENOUS

## 2017-09-04 SURGICAL SUPPLY — 89 items
BENZOIN TINCTURE PRP APPL 2/3 (GAUZE/BANDAGES/DRESSINGS) ×3 IMPLANT
BLADE CLIPPER SURG (BLADE) IMPLANT
BONE VIVIGEN FORMABLE 10CC (Bone Implant) ×3 IMPLANT
BUR PRESCISION 1.7 ELITE (BURR) ×3 IMPLANT
BUR ROUND PRECISION 4.0 (BURR) ×2 IMPLANT
BUR ROUND PRECISION 4.0MM (BURR) ×1
BUR SABER RD CUTTING 3.0 (BURR) IMPLANT
BUR SABER RD CUTTING 3.0MM (BURR)
CAGE CONCORDE BULLET 9X8X27 (Cage) ×1 IMPLANT
CAGE CONCORDE BULLET 9X8X27MM (Cage) ×1 IMPLANT
CAGE SPNL PRLL BLT NOSE 27X9X8 (Cage) ×1 IMPLANT
CARTRIDGE OIL MAESTRO DRILL (MISCELLANEOUS) ×1 IMPLANT
CLOSURE WOUND 1/2 X4 (GAUZE/BANDAGES/DRESSINGS) ×1
CONT SPEC 4OZ CLIKSEAL STRL BL (MISCELLANEOUS) ×12 IMPLANT
COVER MAYO STAND STRL (DRAPES) ×3 IMPLANT
COVER SURGICAL LIGHT HANDLE (MISCELLANEOUS) IMPLANT
DIFFUSER DRILL AIR PNEUMATIC (MISCELLANEOUS) ×3 IMPLANT
DRAIN CHANNEL 15F RND FF W/TCR (WOUND CARE) IMPLANT
DRAPE C-ARM 42X72 X-RAY (DRAPES) ×3 IMPLANT
DRAPE C-ARMOR (DRAPES) ×3 IMPLANT
DRAPE POUCH INSTRU U-SHP 10X18 (DRAPES) IMPLANT
DRAPE SURG 17X23 STRL (DRAPES) ×12 IMPLANT
DURAPREP 26ML APPLICATOR (WOUND CARE) ×3 IMPLANT
ELECT BLADE 4.0 EZ CLEAN MEGAD (MISCELLANEOUS) ×3
ELECT CAUTERY BLADE 6.4 (BLADE) IMPLANT
ELECT REM PT RETURN 9FT ADLT (ELECTROSURGICAL) ×3
ELECTRODE BLDE 4.0 EZ CLN MEGD (MISCELLANEOUS) ×1 IMPLANT
ELECTRODE REM PT RTRN 9FT ADLT (ELECTROSURGICAL) ×1 IMPLANT
EVACUATOR SILICONE 100CC (DRAIN) IMPLANT
FEE INTRAOP MONITOR IMPULS NCS (MISCELLANEOUS) ×1 IMPLANT
GAUZE SPONGE 4X4 12PLY STRL (GAUZE/BANDAGES/DRESSINGS) ×3 IMPLANT
GAUZE SPONGE 4X4 12PLY STRL LF (GAUZE/BANDAGES/DRESSINGS) ×3 IMPLANT
GAUZE SPONGE 4X4 16PLY XRAY LF (GAUZE/BANDAGES/DRESSINGS) ×3 IMPLANT
GLOVE BIO SURGEON STRL SZ7 (GLOVE) ×12 IMPLANT
GLOVE BIO SURGEON STRL SZ8 (GLOVE) ×6 IMPLANT
GLOVE BIOGEL PI IND STRL 7.0 (GLOVE) ×4 IMPLANT
GLOVE BIOGEL PI IND STRL 8 (GLOVE) ×2 IMPLANT
GLOVE BIOGEL PI INDICATOR 7.0 (GLOVE) ×8
GLOVE BIOGEL PI INDICATOR 8 (GLOVE) ×4
GLOVE SURG SS PI 7.0 STRL IVOR (GLOVE) ×12 IMPLANT
GLOVE SURG SS PI 7.5 STRL IVOR (GLOVE) ×6 IMPLANT
GOWN STRL REUS W/ TWL LRG LVL3 (GOWN DISPOSABLE) ×4 IMPLANT
GOWN STRL REUS W/ TWL XL LVL3 (GOWN DISPOSABLE) ×2 IMPLANT
GOWN STRL REUS W/TWL LRG LVL3 (GOWN DISPOSABLE) ×8
GOWN STRL REUS W/TWL XL LVL3 (GOWN DISPOSABLE) ×4
INTRAOP MONITOR FEE IMPULS NCS (MISCELLANEOUS) ×1
INTRAOP MONITOR FEE IMPULSE (MISCELLANEOUS) ×2
IV CATH 14GX2 1/4 (CATHETERS) IMPLANT
KIT BASIN OR (CUSTOM PROCEDURE TRAY) ×3 IMPLANT
KIT POSITION SURG JACKSON T1 (MISCELLANEOUS) ×3 IMPLANT
KIT ROOM TURNOVER OR (KITS) ×3 IMPLANT
MARKER SKIN DUAL TIP RULER LAB (MISCELLANEOUS) ×3 IMPLANT
MILL MEDIUM DISP (BLADE) ×3 IMPLANT
NDL SAFETY ECLIPSE 18X1.5 (NEEDLE) ×1 IMPLANT
NEEDLE 22X1 1/2 (OR ONLY) (NEEDLE) ×6 IMPLANT
NEEDLE HYPO 18GX1.5 SHARP (NEEDLE) ×2
NEEDLE HYPO 25GX1X1/2 BEV (NEEDLE) ×3 IMPLANT
NEEDLE SPNL 18GX3.5 QUINCKE PK (NEEDLE) ×6 IMPLANT
NS IRRIG 1000ML POUR BTL (IV SOLUTION) ×6 IMPLANT
OIL CARTRIDGE MAESTRO DRILL (MISCELLANEOUS) ×3
PACK LAMINECTOMY ORTHO (CUSTOM PROCEDURE TRAY) ×3 IMPLANT
PACK UNIVERSAL I (CUSTOM PROCEDURE TRAY) ×3 IMPLANT
PAD ARMBOARD 7.5X6 YLW CONV (MISCELLANEOUS) ×6 IMPLANT
PATTIES SURGICAL .5 X1 (DISPOSABLE) ×3 IMPLANT
PATTIES SURGICAL .5X1.5 (GAUZE/BANDAGES/DRESSINGS) ×3 IMPLANT
PROBE PEDCLE PROBE MAGSTM DISP (MISCELLANEOUS) ×3 IMPLANT
ROD PRE BENT EXP 40MM (Rod) ×6 IMPLANT
SCREW SET SINGLE INNER (Screw) ×12 IMPLANT
SCREW VIPER CORT FIX 6X35 (Screw) ×12 IMPLANT
SPONGE INTESTINAL PEANUT (DISPOSABLE) ×3 IMPLANT
SPONGE SURGIFOAM ABS GEL 100 (HEMOSTASIS) ×3 IMPLANT
STRIP CLOSURE SKIN 1/2X4 (GAUZE/BANDAGES/DRESSINGS) ×2 IMPLANT
SURGIFLO W/THROMBIN 8M KIT (HEMOSTASIS) IMPLANT
SUT MNCRL AB 4-0 PS2 18 (SUTURE) ×3 IMPLANT
SUT VIC AB 0 CT1 18XCR BRD 8 (SUTURE) ×1 IMPLANT
SUT VIC AB 0 CT1 8-18 (SUTURE) ×2
SUT VIC AB 1 CT1 18XCR BRD 8 (SUTURE) ×1 IMPLANT
SUT VIC AB 1 CT1 8-18 (SUTURE) ×2
SUT VIC AB 2-0 CT2 18 VCP726D (SUTURE) ×3 IMPLANT
SYR 20CC LL (SYRINGE) ×6 IMPLANT
SYR 20ML ECCENTRIC (SYRINGE) ×3 IMPLANT
SYR BULB IRRIGATION 50ML (SYRINGE) ×3 IMPLANT
SYR CONTROL 10ML LL (SYRINGE) ×12 IMPLANT
SYR TB 1ML LUER SLIP (SYRINGE) ×3 IMPLANT
SYRINGE 20CC LL (MISCELLANEOUS) ×3 IMPLANT
TAPE CLOTH SURG 4X10 WHT LF (GAUZE/BANDAGES/DRESSINGS) ×3 IMPLANT
TRAY FOLEY W/METER SILVER 16FR (SET/KITS/TRAYS/PACK) ×3 IMPLANT
WATER STERILE IRR 1000ML POUR (IV SOLUTION) ×3 IMPLANT
YANKAUER SUCT BULB TIP NO VENT (SUCTIONS) ×3 IMPLANT

## 2017-09-04 NOTE — Anesthesia Postprocedure Evaluation (Signed)
Anesthesia Post Note  Patient: Jennifer Rivera  Procedure(s) Performed: LEFT SIDED LUMBAR THREE-FOUR TRANSFORAMINAL LUMBAR INTERBODY FUSION WITH INSTRUMENTATION AND ALLOGRAFT (Left Back)     Patient location during evaluation: PACU Anesthesia Type: General Level of consciousness: awake and alert Pain management: pain level controlled Vital Signs Assessment: post-procedure vital signs reviewed and stable Respiratory status: spontaneous breathing, nonlabored ventilation and respiratory function stable Cardiovascular status: blood pressure returned to baseline and stable Postop Assessment: no apparent nausea or vomiting Anesthetic complications: no    Last Vitals:  Vitals Value Taken Time  BP 126/88 09/04/2017 12:40 PM  Temp    Pulse 102 09/04/2017 12:46 PM  Resp 18 09/04/2017 12:46 PM  SpO2 98 % 09/04/2017 12:46 PM  Vitals shown include unvalidated device data.  Last Pain:  Vitals:   09/04/17 1240  TempSrc:   PainSc: [place]

## 2017-09-04 NOTE — Anesthesia Procedure Notes (Signed)
Procedure Name: Intubation Date/Time: 09/04/2017 7:47 AM Performed by: Shirlyn Goltz, CRNA Pre-anesthesia Checklist: Patient identified, Emergency Drugs available, Suction available and Patient being monitored Patient Re-evaluated:Patient Re-evaluated prior to induction Oxygen Delivery Method: Circle system utilized Preoxygenation: Pre-oxygenation with 100% oxygen Induction Type: IV induction Ventilation: Mask ventilation without difficulty Laryngoscope Size: Mac and 3 Grade View: Grade I Tube type: Oral Tube size: 7.0 mm Number of attempts: 1 Airway Equipment and Method: Stylet Placement Confirmation: ETT inserted through vocal cords under direct vision,  positive ETCO2 and breath sounds checked- equal and bilateral Secured at: 22 cm Tube secured with: Tape Dental Injury: Teeth and Oropharynx as per pre-operative assessment

## 2017-09-04 NOTE — Op Note (Signed)
MEDICAL RECORD NO.:   [id number]   PHYSICIAN:  Phylliss Bob, MD      DATE OF BIRTH:   [birth year]  DATE OF PROCEDURE:   09/04/2017                              OPERATIVE REPORT   PREOPERATIVE DIAGNOSES: 1. Left-sided lumbar radiculopathy. 2. L3-4 stenosis. 3. L3-4 spondylolisthesis  POSTOPERATIVE DIAGNOSES: 1. Left-sided lumbar radiculopathy. 2. L3-4 stenosis. 3. L3-4 spondylolisthesis  PROCEDURES: 1. L3-4 decompression. 2. Left-sided L3-4 transforaminal lumbar interbody fusion. 3. Right-sided L3-4 posterolateral fusion. 4. Insertion of interbody device x1 (8 x 27 mm [place]cor[place]     intervertebral spacer). 5. Placement of posterior instrumentation at L3, L4 bilaterally. 6. Use of local autograft. 7. Use of morselized allograft - ViviGen. 8. Intraoperative use of fluoroscopy.  SURGEON:  Phylliss Bob, MD.  ASSISTANTPricilla Holm, PA-C.  ANESTHESIA:  General endotracheal anesthesia.  COMPLICATIONS:  None.  DISPOSITION:  Stable.  ESTIMATED BLOOD LOSS:   Minimal  INDICATIONS FOR SURGERY:  Briefly, Jennifer Rivera is a pleasant 72 year old patient who did present to me with severe and ongoing pain in the left leg, as well as prominent weakness in the left leg. I did feel that the symptoms were secondary to the findings noted above.   The patient failed conservative care and did wish to proceed with the procedure  noted above.  OPERATIVE DETAILS:  On 09/04/2017, the patient was brought to surgery and general endotracheal anesthesia was administered.  The patient was placed prone on a well-padded flat Jackson bed with a spinal frame.  Antibiotics were given and a time-out procedure was performed. The back was prepped and draped in the usual fashion.  A midline incision was made overlying the L3-4 intervertebral space.  The fascia was incised at the midline.  The paraspinal musculature was bluntly swept laterally.  Anatomic landmarks for the pedicles were  exposed. Using fluoroscopy, I did cannulate the L3 and L4 pedicles bilaterally, using a medial to lateral cortical trajectory technique.  On the right side, the posterolateral gutter and right facet joint at L3-4 was decorticated and 6 x 35 mm screws were placed and a 40-mm rod was placed and distraction was applied across the rod on the right side.  On the left side, the cannulated pedicle holes were filled with bone wax.  I then proceeded with the decompressive aspect of the procedure.  On the left side, I did perform a thorough and complete neuroforaminal decompression, with near-complete removal of the left L3-4 facet joint. I was able to expose the exiting left L5 nerve, which again, was entirely decompressed.  I then explored the epidural space more cephalad, behind the L3 vertebral body, and there was noted to be multiple large extruded disc fragments, clearly compressing the exiting left L3 nerve, and the traversing left L4 nerve.  These herniated fragments were removed entirely. With an assistant holding medial retraction of the traversing left L4 nerve, I did perform an annulotomy at the posterolateral aspect of the L3-4 intervertebral space.  I then used a series of curettes and pituitary rongeurs to perform a thorough and complete intervertebral diskectomy.  The intervertebral space was then liberally packed with autograft as well as allograft in the form of ViviGen, as was the appropriate-sized intervertebral spacer.  The spacer was then tamped into position in the usual fashion.  I was very pleased with the press-fit of the spacer.  I then placed 6 x 35 mm screws on the left at L4 and L5.  A 40-mm rod was then placed and caps were placed. The distraction was then released on the contralateral right side.  All 4 caps were then locked.  The wound was copiously irrigated with a total of approximately 3 L prior to placing the bone graft.  Additional autograft and allograft were then  packed into the posterolateral gutter on the right side to help aid in the L3-4 fusion.  The wound was explored for any undue bleeding and there was no substantial bleeding encountered.  Gel-Foam was placed over the laminectomy site.  The wound was then closed in layers using #1 Vicryl followed by 2-0 Vicryl, followed by 4-0 Monocryl.  Benzoin and Steri-Strips were applied followed by sterile dressing.  Of note, I did use triggered EMG to test the screws on the left, and there was no screw that tested below 20 mA.  There is no abnormal EMG activity noted throughout the entire surgery.  Of note, Pricilla Holm was my assistant throughout surgery, and did aid in retraction, suctioning, and closure.     Phylliss Bob, MD
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
## 2017-09-04 NOTE — Transfer of Care (Signed)
Immediate Anesthesia Transfer of Care Note  Patient: Jennifer Rivera  Procedure(s) Performed: LEFT SIDED LUMBAR THREE-FOUR TRANSFORAMINAL LUMBAR INTERBODY FUSION WITH INSTRUMENTATION AND ALLOGRAFT (Left Back)  Patient Location: PACU  Anesthesia Type:General  Level of Consciousness: awake, alert , oriented and patient cooperative  Airway & Oxygen Therapy: Patient Spontanous Breathing and Patient connected to nasal cannula oxygen  Post-op Assessment: Report given to RN and Post -op Vital signs reviewed and stable  Post vital signs: Reviewed and stable  Last Vitals:  Vitals Value Taken Time  BP 117/70 09/04/2017 11:25 AM  Temp    Pulse 87 09/04/2017 11:28 AM  Resp 11 09/04/2017 11:28 AM  SpO2 100 % 09/04/2017 11:28 AM  Vitals shown include unvalidated device data.  Last Pain:  Vitals:   09/04/17 0619  TempSrc: Oral  PainSc: 3       Patients Stated Pain Goal: 2 (45/62/56 3893)  Complications: No apparent anesthesia complications

## 2017-09-04 NOTE — Evaluation (Signed)
Physical Therapy Evaluation Patient Details Name: Jennifer Rivera MRN: [id number] DOB: 1945/06/19 Today's Date: 09/04/2017   History of Present Illness  Pt is a 72 y/o female s/p L3-4 PLIF. PMH includes DM and breast cancer s/p L mastectomy.   Clinical Impression  Patient is s/p above surgery resulting in the deficits listed below (see PT Problem List). Pt unsteady during gait and heavily reliant on HHA and min A for ambulation. Educated about use of RW to increase safety at home. Reviewed walking program and back precautions. Patient will benefit from skilled PT to increase their independence and safety with mobility (while adhering to their precautions) to allow discharge to the venue listed below.     Follow Up Recommendations No PT follow up;Supervision for mobility/OOB    Equipment Recommendations  None recommended by PT    Recommendations for Other Services       Precautions / Restrictions Precautions Precautions: Back Precaution Booklet Issued: Yes (comment) Precaution Comments: Reviewed back precautions with pt.  Required Braces or Orthoses: Spinal Brace Spinal Brace: Thoracolumbosacral orthotic;Applied in standing position Restrictions Weight Bearing Restrictions: No      Mobility  Bed Mobility Overal bed mobility: Needs Assistance Bed Mobility: Sidelying to Sit;Sit to Sidelying   Sidelying to sit: Min guard     Sit to sidelying: Min assist General bed mobility comments: Min guard to ensure use of log roll technique. Min A for LLE assist to return to sidelying.   Transfers Overall transfer level: Needs assistance Equipment used: 1 person hand held assist Transfers: Sit to/from Stand Sit to Stand: Mod assist         General transfer comment: Mod A to power into standing with HHA.   Ambulation/Gait Ambulation/Gait assistance: Min assist Ambulation Distance (Feet): 120 Feet Assistive device: 1 person hand held assist(IV pole) Gait Pattern/deviations:  Step-through pattern;Decreased stride length Gait velocity: Decreased  Gait velocity interpretation: Below normal speed for age/gender General Gait Details: Slow, cautious, unsteady gait. Heavily reliant on HHA to maintain balance. Min A for steadying assist. Educated about use of RW at home to increase safety. Educated about generalized walking program to perform at home.   Stairs            Wheelchair Mobility    Modified Rankin (Stroke Patients Only)       Balance Overall balance assessment: Needs assistance Sitting-balance support: No upper extremity supported;Feet supported Sitting balance-Leahy Scale: Good     Standing balance support: Bilateral upper extremity supported;During functional activity Standing balance-Leahy Scale: Poor Standing balance comment: Reliant on UE support and external assist                              Pertinent Vitals/Pain Pain Assessment: Faces Faces Pain Scale: Hurts even more Pain Location: back  Pain Descriptors / Indicators: Aching;Operative site guarding Pain Intervention(s): Limited activity within patient's tolerance;Monitored during session;Premedicated before session;Repositioned    Home Living Family/patient expects to be discharged to:: Private residence Living Arrangements: Spouse/significant other Available Help at Discharge: Family;Available 24 hours/day Type of Home: House Home Access: Stairs to enter Entrance Stairs-Rails: Left Entrance Stairs-Number of Steps: 3 Home Layout: Two level;Able to live on main level with bedroom/bathroom Home Equipment: Gilford Rile - 2 wheels;Bedside commode      Prior Function Level of Independence: Independent               Hand Dominance        Extremity/Trunk  Assessment   Upper Extremity Assessment Upper Extremity Assessment: Defer to OT evaluation    Lower Extremity Assessment Lower Extremity Assessment: LLE deficits/detail LLE Deficits / Details: LLE  weakness at baseline     Cervical / Trunk Assessment Cervical / Trunk Assessment: Other exceptions Cervical / Trunk Exceptions: s/p PLIF   Communication   Communication: No difficulties  Cognition Arousal/Alertness: Awake/alert Behavior During Therapy: WFL for tasks assessed/performed Overall Cognitive Status: Within Functional Limits for tasks assessed                                        General Comments General comments (skin integrity, edema, etc.): Pt's husband present throughout session.     Exercises     Assessment/Plan    PT Assessment Patient needs continued PT services  PT Problem List Decreased strength;Decreased balance;Decreased mobility;Decreased knowledge of use of DME;Decreased knowledge of precautions;Pain       PT Treatment Interventions DME instruction;Gait training;Functional mobility training;Stair training;Therapeutic activities;Balance training;Therapeutic exercise;Neuromuscular re-education;Patient/family education    PT Goals (Current goals can be found in the Care Plan section)  Acute Rehab PT Goals Patient Stated Goal: to go home  PT Goal Formulation: With patient Time For Goal Achievement: 09/18/17 Potential to Achieve Goals: Good    Frequency Min 5X/week   Barriers to discharge        Co-evaluation               AM-PAC PT "6 Clicks" Daily Activity  Outcome Measure Difficulty turning over in bed (including adjusting bedclothes, sheets and blankets)?: A Little Difficulty moving from lying on back to sitting on the side of the bed? : Unable Difficulty sitting down on and standing up from a chair with arms (e.g., wheelchair, bedside commode, etc,.)?: Unable Help needed moving to and from a bed to chair (including a wheelchair)?: A Little Help needed walking in hospital room?: A Little Help needed climbing 3-5 steps with a railing? : A Lot 6 Click Score: 13    End of Session Equipment Utilized During Treatment:  Gait belt;Back brace Activity Tolerance: Patient tolerated treatment well Patient left: in bed;with call bell/phone within reach;with family/visitor present Nurse Communication: Mobility status PT Visit Diagnosis: Other abnormalities of gait and mobility (R26.89);Unsteadiness on feet (R26.81);Pain Pain - part of body: (back)    Time: 0093-8182 PT Time Calculation (min) (ACUTE ONLY): 29 min   Charges:   PT Evaluation $PT Eval Low Complexity: 1 Low PT Treatments $Gait Training: 8-22 mins   PT G Codes:        Leighton Ruff, PT, DPT  Acute Rehabilitation Services  Pager: [phone]   Rudean Hitt 09/04/2017, 3:15 PM

## 2017-09-05 ENCOUNTER — Other Ambulatory Visit: Payer: Self-pay

## 2017-09-05 LAB — GLUCOSE, CAPILLARY: Glucose-Capillary: 127 mg/dL — ABNORMAL HIGH (ref 65–99)

## 2017-09-05 NOTE — Progress Notes (Signed)
Physical Therapy Treatment Patient Details Name: Jennifer Rivera MRN: [id number] DOB: [birth year] Today's Date: 09/05/2017    History of Present Illness Pt is a 72 y/o female s/p L3-4 PLIF. PMH includes DM and breast cancer s/p L mastectomy.     PT Comments    Pt progressing slowly towards physical therapy goals. Pt appears to be self-limited by anxiety and fear of falling. With RW, pt was able to maneuver fairly well with hands-on assist throughout for safety and occasional walker management. Husband present for session today. Both the patient and husband were educated on the benefits of the patient being as independent as possible and not relying on her husband to lift her up or move her around in the bed. They were also educated on brace application/wearing schedule, car transfer, and general safety with mobility progression. Will continue to follow and progress as able per POC.    Follow Up Recommendations  No PT follow up;Supervision for mobility/OOB     Equipment Recommendations  None recommended by PT    Recommendations for Other Services       Precautions / Restrictions Precautions Precautions: Back Precaution Booklet Issued: Yes (comment) Precaution Comments: Reviewed back precautions with pt.  Required Braces or Orthoses: Spinal Brace Spinal Brace: Thoracolumbosacral orthotic;Applied in sitting position Restrictions Weight Bearing Restrictions: No    Mobility  Bed Mobility Overal bed mobility: Needs Assistance Bed Mobility: Sit to Sidelying;Rolling Rolling: Supervision       Sit to sidelying: Mod assist General bed mobility comments: Pt required increased assist to elevate LE's up onto bed at end of session. Increased time required as well for all aspects of bed mobility.   Transfers Overall transfer level: Needs assistance Equipment used: Rolling walker (2 wheeled) Transfers: Sit to/from Stand Sit to Stand: Mod assist;Min assist;From elevated surface          General transfer comment: Min assist initially progressing to heavy mod assist for power-up to full stand by end of session. Pt appears anxious about falls and asks husband to lift her up. Both pt and husband were educated on safety and not having someone lift her up if at all possible.   Ambulation/Gait Ambulation/Gait assistance: Min assist Ambulation Distance (Feet): 100 Feet Assistive device: Rolling walker (2 wheeled)(IV pole) Gait Pattern/deviations: Step-through pattern;Decreased stride length Gait velocity: Decreased  Gait velocity interpretation: Below normal speed for age/gender General Gait Details: Slow and unsteady even with RW. Pt attempted gait training x2 - on first attempt pt became diaphoretic and lightheaded and we returned to sitting. BP stable during this time (systolically in 580'D). Upon second attempt, pt was able to ambulate to the stairwell and back.    Stairs Stairs: Yes   Stair Management: One rail Left;Sideways Number of Stairs: 4 General stair comments: Assist for balance support. Pt was cued for improved posture, sequencing and general safety. Husband present for education as well.   Wheelchair Mobility    Modified Rankin (Stroke Patients Only)       Balance Overall balance assessment: Needs assistance Sitting-balance support: No upper extremity supported;Feet supported Sitting balance-Leahy Scale: Good Sitting balance - Comments: static sitting EOB    Standing balance support: Bilateral upper extremity supported;During functional activity Standing balance-Leahy Scale: Poor Standing balance comment: Reliant on UE support at this time                             Cognition Arousal/Alertness: Awake/alert Behavior During Therapy:  WFL for tasks assessed/performed Overall Cognitive Status: Within Functional Limits for tasks assessed                                        Exercises      General Comments         Pertinent Vitals/Pain Pain Assessment: Faces Faces Pain Scale: Hurts whole lot Pain Location: back  Pain Descriptors / Indicators: Aching;Operative site guarding Pain Intervention(s): Monitored during session    Home Living Family/patient expects to be discharged to:: Private residence Living Arrangements: Spouse/significant other Available Help at Discharge: Family;Available 24 hours/day Type of Home: House Home Access: Stairs to enter Entrance Stairs-Rails: Left Home Layout: Two level;Able to live on main level with bedroom/bathroom Home Equipment: Gilford Rile - 2 wheels;Bedside commode;Adaptive equipment      Prior Function Level of Independence: Independent          PT Goals (current goals can now be found in the care plan section) Acute Rehab PT Goals Patient Stated Goal: to go home  PT Goal Formulation: With patient Time For Goal Achievement: 09/18/17 Potential to Achieve Goals: Good Progress towards PT goals: Progressing toward goals    Frequency    Min 5X/week      PT Plan Current plan remains appropriate    Co-evaluation              AM-PAC PT "6 Clicks" Daily Activity  Outcome Measure  Difficulty turning over in bed (including adjusting bedclothes, sheets and blankets)?: A Little Difficulty moving from lying on back to sitting on the side of the bed? : Unable Difficulty sitting down on and standing up from a chair with arms (e.g., wheelchair, bedside commode, etc,.)?: A Little Help needed moving to and from a bed to chair (including a wheelchair)?: A Little Help needed walking in hospital room?: A Little Help needed climbing 3-5 steps with a railing? : A Little 6 Click Score: 16    End of Session Equipment Utilized During Treatment: Gait belt;Back brace Activity Tolerance: Patient tolerated treatment well Patient left: in bed;with call bell/phone within reach;with family/visitor present Nurse Communication: Mobility status PT Visit Diagnosis:  Other abnormalities of gait and mobility (R26.89);Unsteadiness on feet (R26.81);Pain Pain - part of body: (back)     Time: 9509-3267 PT Time Calculation (min) (ACUTE ONLY): 39 min  Charges:  $Gait Training: 38-52 mins                    G Codes:       Rolinda Roan, PT, DPT Acute Rehabilitation Services Pager: [phone]    Jennifer Rivera 09/05/2017, 11:02 AM

## 2017-09-05 NOTE — Progress Notes (Signed)
Patient is discharged from room 3C11 at this time. Alert and in stable condition. IV site d/c'd and instructions read to patient and spouse with understanding verbalized. Left unit via wheelchair with all belongings at side. 

## 2017-09-05 NOTE — Progress Notes (Signed)
    Patient doing well  Patient denies left leg pain Has been ambulating   Physical Exam: Vitals:   09/05/17 0020 09/05/17 0400  BP:  (P) 129/64  Pulse:  (P) 80  Resp:  (P) 18  Temp: 99.8 F (37.7 C) (P) 98.7 F (37.1 C)  SpO2:  (P) 97%    Dressing in place NVI  POD #1 s/p L3/4 decompression and fusion, doing well  - up with PT/OT, encourage ambulation - Percocet for pain, Valium for muscle spasms - d/c home today with f/u in 2 weeks

## 2017-09-05 NOTE — Evaluation (Signed)
Occupational Therapy Evaluation Patient Details Name: Jennifer Rivera MRN: [id number] DOB: 20-Jun-1945 Today's Date: 09/05/2017    History of Present Illness Pt is a 72 y/o female s/p L3-4 PLIF. PMH includes DM and breast cancer s/p L mastectomy.    Clinical Impression   This 72 y/o F presents with the above. At baseline pt is independent with ADLs and functional mobility. Pt currently requiring MinA for functional mobility using RW, ModA for LB ADLs secondary to adhering to back precautions. Educated pt and pt spouse on AE, safety and compensatory techniques for completing ADLs and functional transfers while adhering to back precautions with pt return demonstrating and verbalizing understanding. Pt's spouse will be available after return home to assist with ADLs/functional transfers PRN. Will continue to follow while pt remains in acute setting to maximize her overall safety and independence with ADLs and mobility while adhering to back precautions.     Follow Up Recommendations  Follow surgeon's recommendation for DC plan and follow-up therapies;Supervision/Assistance - 24 hour    Equipment Recommendations  None recommended by OT(Pt DME needs are met )           Precautions / Restrictions Precautions Precautions: Back Precaution Booklet Issued: Yes (comment) Precaution Comments: Reviewed back precautions with pt.  Required Braces or Orthoses: Spinal Brace Spinal Brace: Thoracolumbosacral orthotic;Applied in standing position Restrictions Weight Bearing Restrictions: No      Mobility Bed Mobility               General bed mobility comments: OOB in recliner upon arrival   Transfers Overall transfer level: Needs assistance Equipment used: Rolling walker (2 wheeled) Transfers: Sit to/from Stand Sit to Stand: Mod assist         General transfer comment: pt requires increased time and multiple attempts to scoot to edge of chair and to perform sit<>stand with verbal cues  for hand placement, assist to boost up and steady at RW     Balance Overall balance assessment: Needs assistance Sitting-balance support: No upper extremity supported;Feet supported Sitting balance-Leahy Scale: Good Sitting balance - Comments: static sitting EOB    Standing balance support: Bilateral upper extremity supported;During functional activity Standing balance-Leahy Scale: Poor Standing balance comment: Reliant on UE support at this time                            ADL either performed or assessed with clinical judgement   ADL Overall ADL's : Needs assistance/impaired Eating/Feeding: Modified independent;Sitting   Grooming: Sitting;Set up;Minimal assistance;Standing   Upper Body Bathing: Min guard;Sitting   Lower Body Bathing: Moderate assistance;Sit to/from stand   Upper Body Dressing : Minimal assistance;Sitting Upper Body Dressing Details (indicate cue type and reason): MinA to don TLSO, pt donning overhead shirt with setup assist  Lower Body Dressing: Moderate assistance;Sit to/from stand Lower Body Dressing Details (indicate cue type and reason): educated on use of sock aide and reacher with pt return demonstrating; pt reports spouse also able to assist  Toilet Transfer: Minimal assistance;Ambulation;BSC;RW Toilet Transfer Details (indicate cue type and reason): BSC over toilet; simulated in transfer recliner to [place] and Hygiene: Minimal assistance;Sit to/from Nurse, children's Details (indicate cue type and reason): educated on safe transfer technique to walk-in shower while using RW as well as using 3:1 as shower seat  Functional mobility during ADLs: Minimal assistance;Rolling walker General ADL Comments: pt with increased difficulty completing sit<>stand (partly  due to pt reports fear of falling); minA once standing at RW; educated pt and pt spouse on AE, safety and compensatory techniques for completing ADLs  and functional transfers while adhering to back precautions                         Pertinent Vitals/Pain Pain Assessment: Faces Faces Pain Scale: Hurts little more Pain Location: back  Pain Descriptors / Indicators: Aching;Operative site guarding Pain Intervention(s): Monitored during session;Repositioned          Extremity/Trunk Assessment Upper Extremity Assessment Upper Extremity Assessment: Generalized weakness   Lower Extremity Assessment Lower Extremity Assessment: Defer to PT evaluation   Cervical / Trunk Assessment Cervical / Trunk Assessment: Other exceptions Cervical / Trunk Exceptions: s/p PLIF    Communication Communication Communication: No difficulties   Cognition Arousal/Alertness: Awake/alert Behavior During Therapy: WFL for tasks assessed/performed Overall Cognitive Status: Within Functional Limits for tasks assessed                                                      Home Living Family/patient expects to be discharged to:: Private residence Living Arrangements: Spouse/significant other Available Help at Discharge: Family;Available 24 hours/day Type of Home: House Home Access: Stairs to enter [employer] of Steps: 3 Entrance Stairs-Rails: Left Home Layout: Two level;Able to live on main level with bedroom/bathroom     Bathroom Shower/Tub: Occupational psychologist: Standard     Home Equipment: Environmental consultant - 2 wheels;Bedside commode;Adaptive equipment Adaptive Equipment: Reacher        Prior Functioning/Environment Level of Independence: Independent                 OT Problem List: Decreased strength;Decreased range of motion;Impaired balance (sitting and/or standing);Decreased knowledge of use of DME or AE;Decreased knowledge of precautions      OT Treatment/Interventions: Self-care/ADL training;DME and/or AE instruction;Balance training;Therapeutic activities;Therapeutic exercise;Energy  conservation;Patient/family education    OT Goals(Current goals can be found in the care plan section) Acute Rehab OT Goals Patient Stated Goal: to go home  OT Goal Formulation: With patient Time For Goal Achievement: 09/19/17 Potential to Achieve Goals: Good  OT Frequency: Min 2X/week                             AM-PAC PT "6 Clicks" Daily Activity     Outcome Measure Help from another person eating meals?: None Help from another person taking care of personal grooming?: A Little Help from another person toileting, which includes using toliet, bedpan, or urinal?: A Little Help from another person bathing (including washing, rinsing, drying)?: A Lot Help from another person to put on and taking off regular upper body clothing?: A Little Help from another person to put on and taking off regular lower body clothing?: A Lot 6 Click Score: 17   End of Session Equipment Utilized During Treatment: Gait belt;Rolling walker;Back brace Nurse Communication: Mobility status  Activity Tolerance: Patient tolerated treatment well Patient left: with call bell/phone within reach;Other (comment)(sitting EOB, handoff to PT)  OT Visit Diagnosis: Other abnormalities of gait and mobility (R26.89);Unsteadiness on feet (R26.81)                Time: 1540-0867 OT Time Calculation (min): 35 min  Charges:  OT General Charges $OT Visit: 1 Visit OT Evaluation $OT Eval Low Complexity: 1 Low OT Treatments $Self Care/Home Management : 8-22 mins G-Codes:     Lou Cal, OT Pager [phone] 09/05/2017   Raymondo Band 09/05/2017, 10:47 AM

## 2017-09-09 MED FILL — Heparin Sodium (Porcine) Inj 1000 Unit/ML: INTRAMUSCULAR | Qty: 30 | Status: AC

## 2017-09-09 MED FILL — Sodium Chloride IV Soln 0.9%: INTRAVENOUS | Qty: 1000 | Status: AC

## 2017-09-11 NOTE — Discharge Summary (Signed)
Patient ID: Jennifer Rivera MRN: [id number] DOB/AGE: [birth year] 73 y.o.  Admit date: 09/04/2017 Discharge date: 09/05/2017  Admission Diagnoses:  Active Problems:   Radiculopathy   Discharge Diagnoses:  Same  Past Medical History:  Diagnosis Date  . 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 Breast cancer ([place])   . Diabetes mellitus   . Hyperlipidemia    controlled  . 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 Pain     Surgeries: Procedure(s): LEFT SIDED LUMBAR THREE-FOUR TRANSFORAMINAL LUMBAR INTERBODY FUSION WITH INSTRUMENTATION AND ALLOGRAFT on 09/04/2017   Consultants: None  Discharged Condition: Improved  Hospital Course: Jennifer Rivera is an 72 y.o. female who was admitted 09/04/2017 for operative treatment of radiculopathy. Patient has severe unremitting pain that affects sleep, daily activities, and work/hobbies. After pre-op clearance the patient was taken to the operating room on 09/04/2017 and underwent  Procedure(s): LEFT SIDED LUMBAR THREE-FOUR TRANSFORAMINAL LUMBAR INTERBODY FUSION WITH INSTRUMENTATION AND ALLOGRAFT.    Patient was given perioperative antibiotics:  Anti-infectives (From admission, onward)   Start     Dose/Rate Route Frequency Ordered Stop   09/04/17 1600  ceFAZolin (ANCEF) IVPB 2g/100 mL premix     2 g 200 mL/hr over 30 Minutes Intravenous Every 8 hours 09/04/17 1323 09/05/17 0110   09/04/17 1315  ceFAZolin (ANCEF) IVPB 2g/100 mL premix  Status:  Discontinued     2 g 200 mL/hr over 30 Minutes Intravenous Every 8 hours 09/04/17 1311 09/04/17 1323   09/04/17 0600  ceFAZolin (ANCEF) IVPB 2g/100 mL premix     2 g 200 mL/hr over 30 Minutes Intravenous On call to O.R. 09/04/17 0601 09/04/17 0815       Patient was given sequential compression devices, early ambulation to prevent DVT.  Patient benefited maximally from hospital stay and there were no complications.    Recent vital signs: BP (!)  111/56 Comment: therapy eval.  Pulse 70   Temp 98.6 F (37 C) (Oral)   Resp 18   Ht 5\' 7"  (1.702 m)   Wt 82.2 kg (181 lb 4.8 oz)   SpO2 95%   BMI 28.40 kg/m    Discharge Medications:   Allergies as of 09/05/2017      Reactions   Sulfa Antibiotics Rash      Medication List    STOP taking these medications   acetaminophen 325 MG tablet Commonly known as:  TYLENOL     TAKE these medications   CRESTOR 10 MG tablet Generic drug:  rosuvastatin Take 10 mg by mouth every evening.   diphenhydramine-acetaminophen 25-500 MG Tabs tablet Commonly known as:  TYLENOL PM Take 1 tablet by mouth at bedtime as needed (sleep/pain).   latanoprost 0.005 % ophthalmic solution Commonly known as:  XALATAN Place 1 drop into both eyes at bedtime.   metFORMIN 1000 MG tablet Commonly known as:  GLUCOPHAGE Take 1,000 mg by mouth daily.   multivitamin with minerals Tabs tablet Take 1 tablet by mouth daily.   SUPER B COMPLEX/C PO Take 1 tablet by mouth daily.   tamoxifen 20 MG tablet Commonly known as:  NOLVADEX Take 1 tablet (20 mg total) by mouth daily. What changed:  when to take this       Diagnostic Studies: Dg Lumbar Spine 2-3 Views  Result Date: 09/04/2017 CLINICAL DATA:  L3-4 lumbar fusion. EXAM: LUMBAR SPINE - 2-3 VIEW; DG C-ARM 61-120 MIN COMPARISON:  Intraoperative lumbar spine  radiograph obtained earlier today. FINDINGS: There are probably 5 non-rib-bearing lumbar vertebrae on the intraoperative lumbar spine radiograph earlier today. The last open disc space is labeled the L5-S1 level. PA and lateral C-arm views of the lumbar spine demonstrate interbody and pedicle screw and rod fixation at the L3-4 level with grade 1 anterolisthesis. Laminectomy defects are noted at that level. IMPRESSION: Interbody and hardware fusion at the L3-4 level with grade 1 anterolisthesis. Electronically Signed   By: Claudie Revering M.D.   On: 09/04/2017 12:54   Dg Lumbar Spine 1 View  Result Date:  09/04/2017 CLINICAL DATA:  Localization image for PLIF EXAM: LUMBAR SPINE - 1 VIEW COMPARISON:  None. FINDINGS: Single lateral view of the spine. There are 2 linear localizing instruments, the more cephalad instrument points to L3. The more inferior instrument points to the L4-L5 disc level. Degenerative changes at L3-L4. IMPRESSION: Lateral view of the spine for localization prior to spine surgery. Electronically Signed   By: Donavan Foil M.D.   On: 09/04/2017 19:36   Dg C-arm 1-60 Min  Result Date: 09/04/2017 CLINICAL DATA:  L3-4 lumbar fusion. EXAM: LUMBAR SPINE - 2-3 VIEW; DG C-ARM 61-120 MIN COMPARISON:  Intraoperative lumbar spine radiograph obtained earlier today. FINDINGS: There are probably 5 non-rib-bearing lumbar vertebrae on the intraoperative lumbar spine radiograph earlier today. The last open disc space is labeled the L5-S1 level. PA and lateral C-arm views of the lumbar spine demonstrate interbody and pedicle screw and rod fixation at the L3-4 level with grade 1 anterolisthesis. Laminectomy defects are noted at that level. IMPRESSION: Interbody and hardware fusion at the L3-4 level with grade 1 anterolisthesis. Electronically Signed   By: Claudie Revering M.D.   On: 09/04/2017 12:54   Dg C-arm 1-60 Min  Result Date: 09/04/2017 CLINICAL DATA:  L3-4 lumbar fusion. EXAM: LUMBAR SPINE - 2-3 VIEW; DG C-ARM 61-120 MIN COMPARISON:  Intraoperative lumbar spine radiograph obtained earlier today. FINDINGS: There are probably 5 non-rib-bearing lumbar vertebrae on the intraoperative lumbar spine radiograph earlier today. The last open disc space is labeled the L5-S1 level. PA and lateral C-arm views of the lumbar spine demonstrate interbody and pedicle screw and rod fixation at the L3-4 level with grade 1 anterolisthesis. Laminectomy defects are noted at that level. IMPRESSION: Interbody and hardware fusion at the L3-4 level with grade 1 anterolisthesis. Electronically Signed   By: Claudie Revering M.D.   On:  09/04/2017 12:54    Disposition:   POD #1 s/p L3/4 decompression and fusion, doing well  - up with PT/OT, encourage ambulation - Percocet for pain, Valium for muscle spasms -Written scripts for pain signed and in chart -D/C instructions sheet printed and in chart -D/C today  -F/U in office 2 weeks   Signed: Justice Britain 09/11/2017, 9:11 AM

## 2017-09-17 DIAGNOSIS — Z9889 Other specified postprocedural states: Secondary | ICD-10-CM | POA: Diagnosis not present

## 2017-10-09 DIAGNOSIS — H401113 Primary open-angle glaucoma, right eye, severe stage: Secondary | ICD-10-CM | POA: Diagnosis not present

## 2017-10-09 DIAGNOSIS — H2513 Age-related nuclear cataract, bilateral: Secondary | ICD-10-CM | POA: Diagnosis not present

## 2017-10-09 DIAGNOSIS — H401121 Primary open-angle glaucoma, left eye, mild stage: Secondary | ICD-10-CM | POA: Diagnosis not present

## 2017-10-17 DIAGNOSIS — M5416 Radiculopathy, lumbar region: Secondary | ICD-10-CM | POA: Diagnosis not present

## 2017-11-28 DIAGNOSIS — M5416 Radiculopathy, lumbar region: Secondary | ICD-10-CM | POA: Diagnosis not present

## 2018-01-20 DIAGNOSIS — Z6827 Body mass index (BMI) 27.0-27.9, adult: Secondary | ICD-10-CM | POA: Diagnosis not present

## 2018-01-20 DIAGNOSIS — I10 Essential (primary) hypertension: Secondary | ICD-10-CM | POA: Diagnosis not present

## 2018-01-20 DIAGNOSIS — M1990 Unspecified osteoarthritis, unspecified site: Secondary | ICD-10-CM | POA: Diagnosis not present

## 2018-01-20 DIAGNOSIS — E668 Other obesity: Secondary | ICD-10-CM | POA: Diagnosis not present

## 2018-01-20 DIAGNOSIS — E119 Type 2 diabetes mellitus without complications: Secondary | ICD-10-CM | POA: Diagnosis not present

## 2018-01-20 DIAGNOSIS — Z23 Encounter for immunization: Secondary | ICD-10-CM | POA: Diagnosis not present

## 2018-02-24 DIAGNOSIS — M545 Low back pain: Secondary | ICD-10-CM | POA: Diagnosis not present

## 2018-03-05 ENCOUNTER — Other Ambulatory Visit: Payer: Self-pay | Admitting: Oncology

## 2018-03-05 DIAGNOSIS — Z1231 Encounter for screening mammogram for malignant neoplasm of breast: Secondary | ICD-10-CM

## 2018-03-14 DIAGNOSIS — Z23 Encounter for immunization: Secondary | ICD-10-CM | POA: Diagnosis not present

## 2018-04-14 DIAGNOSIS — H401113 Primary open-angle glaucoma, right eye, severe stage: Secondary | ICD-10-CM | POA: Diagnosis not present

## 2018-04-14 DIAGNOSIS — H401121 Primary open-angle glaucoma, left eye, mild stage: Secondary | ICD-10-CM | POA: Diagnosis not present

## 2018-04-15 ENCOUNTER — Ambulatory Visit
Admission: RE | Admit: 2018-04-15 | Discharge: 2018-04-15 | Disposition: A | Discharge status: Home / Self Care | Payer: Medicare Other | Source: Ambulatory Visit | Attending: Oncology | Admitting: Oncology

## 2018-04-15 DIAGNOSIS — Z1231 Encounter for screening mammogram for malignant neoplasm of breast: Secondary | ICD-10-CM
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
## 2018-04-20 ENCOUNTER — Inpatient Hospital Stay: Payer: Medicare Other | Attending: Oncology | Admitting: Oncology

## 2018-04-20 ENCOUNTER — Encounter: Payer: Self-pay | Admitting: Oncology

## 2018-04-20 ENCOUNTER — Telehealth: Payer: Self-pay | Admitting: Oncology

## 2018-04-20 ENCOUNTER — Inpatient Hospital Stay: Payer: Medicare Other

## 2018-04-20 VITALS — BP 135/84 | HR 83 | Temp 98.4°F | Resp 18 | Ht 67.0 in | Wt 175.8 lb

## 2018-04-20 DIAGNOSIS — Z923 Personal history of irradiation: Secondary | ICD-10-CM | POA: Insufficient documentation

## 2018-04-20 DIAGNOSIS — E119 Type 2 diabetes mellitus without complications: Secondary | ICD-10-CM | POA: Diagnosis not present

## 2018-04-20 DIAGNOSIS — Z170 Estrogen receptor positive status [ER+]: Secondary | ICD-10-CM | POA: Diagnosis not present

## 2018-04-20 DIAGNOSIS — Z9012 Acquired absence of left breast and nipple: Secondary | ICD-10-CM | POA: Diagnosis not present

## 2018-04-20 DIAGNOSIS — Z79810 Long term (current) use of selective estrogen receptor modulators (SERMs): Secondary | ICD-10-CM | POA: Insufficient documentation

## 2018-04-20 DIAGNOSIS — Z421 Encounter for breast reconstruction following mastectomy: Secondary | ICD-10-CM

## 2018-04-20 DIAGNOSIS — Z7984 Long term (current) use of oral hypoglycemic drugs: Secondary | ICD-10-CM

## 2018-04-20 DIAGNOSIS — C50512 Malignant neoplasm of lower-outer quadrant of left female breast: Secondary | ICD-10-CM | POA: Insufficient documentation

## 2018-04-20 DIAGNOSIS — C50912 Malignant neoplasm of unspecified site of left female breast: Secondary | ICD-10-CM

## 2018-04-20 DIAGNOSIS — Z79899 Other long term (current) drug therapy: Secondary | ICD-10-CM | POA: Diagnosis not present

## 2018-04-20 DIAGNOSIS — I10 Essential (primary) hypertension: Secondary | ICD-10-CM | POA: Diagnosis not present

## 2018-04-20 DIAGNOSIS — C50812 Malignant neoplasm of overlapping sites of left female breast: Secondary | ICD-10-CM

## 2018-04-20 LAB — COMPREHENSIVE METABOLIC PANEL
ALBUMIN: 4.3 g/dL (ref 3.5–5.0)
ALT: 20 U/L (ref 0–44)
ANION GAP: 9 (ref 5–15)
AST: 21 U/L (ref 15–41)
Alkaline Phosphatase: 49 U/L (ref 38–126)
BUN: 20 mg/dL (ref 8–23)
CALCIUM: 10.5 mg/dL — AB (ref 8.9–10.3)
CHLORIDE: 108 mmol/L (ref 98–111)
CO2: 24 mmol/L (ref 22–32)
Creatinine, Ser: 0.83 mg/dL (ref 0.44–1.00)
GFR calc non Af Amer: >60 mL/min (ref >60)
GLUCOSE: 110 mg/dL — AB (ref 70–99)
Potassium: 4.5 mmol/L (ref 3.5–5.1)
SODIUM: 141 mmol/L (ref 135–145)
Total Bilirubin: 0.8 mg/dL (ref 0.3–1.2)
Total Protein: 6.9 g/dL (ref 6.5–8.1)

## 2018-04-20 LAB — CBC WITH DIFFERENTIAL/PLATELET
Abs Immature Granulocytes: 0.01 10*3/uL (ref 0.00–0.07)
BASOS ABS: 0 10*3/uL (ref 0.0–0.1)
BASOS PCT: 1 %
EOS ABS: 0.1 10*3/uL (ref 0.0–0.5)
EOS PCT: 1 %
HEMATOCRIT: 40.7 % (ref 36.0–46.0)
Hemoglobin: 13 g/dL (ref 12.0–15.0)
Immature Granulocytes: 0 %
LYMPHS PCT: 29 %
Lymphs Abs: 1.5 10*3/uL (ref 0.7–4.0)
MCH: 30.4 pg (ref 26.0–34.0)
MCHC: 31.9 g/dL (ref 30.0–36.0)
MCV: 95.3 fL (ref 80.0–100.0)
MONOS PCT: 9 %
Monocytes Absolute: 0.5 10*3/uL (ref 0.1–1.0)
NEUTROS ABS: 3.2 10*3/uL (ref 1.7–7.7)
NEUTROS PCT: 60 %
NRBC: 0 % (ref 0.0–0.2)
Platelets: 215 10*3/uL (ref 150–400)
RBC: 4.27 MIL/uL (ref 3.87–5.11)
RDW: 13.9 % (ref 11.5–15.5)
WBC: 5.3 10*3/uL (ref 4.0–10.5)

## 2018-04-20 NOTE — Telephone Encounter (Signed)
Dr. Jana Hakim will be putting in order for physical therapy.  Patient aware.

## 2018-04-21 ENCOUNTER — Other Ambulatory Visit: Payer: Self-pay | Admitting: Oncology

## 2018-04-21 DIAGNOSIS — C7981 Secondary malignant neoplasm of breast: Secondary | ICD-10-CM

## 2018-05-01 ENCOUNTER — Encounter: Payer: Self-pay | Admitting: Physical Therapy

## 2018-05-01 ENCOUNTER — Ambulatory Visit: Payer: Medicare Other | Attending: Oncology | Admitting: Physical Therapy

## 2018-05-01 ENCOUNTER — Other Ambulatory Visit: Payer: Self-pay

## 2018-05-01 DIAGNOSIS — R262 Difficulty in walking, not elsewhere classified: Secondary | ICD-10-CM | POA: Diagnosis not present

## 2018-05-01 DIAGNOSIS — M6281 Muscle weakness (generalized): Secondary | ICD-10-CM | POA: Insufficient documentation
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
## 2018-05-05 ENCOUNTER — Other Ambulatory Visit: Payer: Self-pay

## 2018-05-05 ENCOUNTER — Encounter: Payer: Self-pay | Admitting: Physical Therapy

## 2018-05-05 ENCOUNTER — Ambulatory Visit: Payer: Medicare Other | Admitting: Physical Therapy

## 2018-05-05 DIAGNOSIS — M6281 Muscle weakness (generalized): Secondary | ICD-10-CM | POA: Diagnosis not present

## 2018-05-05 DIAGNOSIS — R262 Difficulty in walking, not elsewhere classified: Secondary | ICD-10-CM

## 2018-05-05 NOTE — Patient Instructions (Signed)
Cancer Rehab [id number] HIP: Flexion Standing    Holding onto counter lift leg straight in front keeping knee straight. Slow and controlled! _10__ reps per set, _2-3__ sets per day. Then repeat with other leg.   Hip Extension (Standing)    Stand with support at counter. Squeeze pelvic floor and hold so as not to twist hips and don't lean forward.  Move right leg backward with straight knee. Slow and controlled! Repeat _10__ times. Do _2-3__ times a day. Repeat with other leg.  Hip Abduction (Standing)    Stand with support. Squeeze pelvic floor and hold. Lift right leg out to side, keeping toe forward.  Repeat _10__ times. Do _2-3__ times a day. Repeat with other leg.
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
## 2018-05-08 ENCOUNTER — Other Ambulatory Visit: Payer: Self-pay

## 2018-05-08 ENCOUNTER — Ambulatory Visit: Payer: Medicare Other | Admitting: Physical Therapy

## 2018-05-08 ENCOUNTER — Encounter: Payer: Self-pay | Admitting: Physical Therapy

## 2018-05-08 DIAGNOSIS — M6281 Muscle weakness (generalized): Secondary | ICD-10-CM

## 2018-05-08 DIAGNOSIS — R262 Difficulty in walking, not elsewhere classified: Secondary | ICD-10-CM
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
## 2018-05-11 ENCOUNTER — Ambulatory Visit: Payer: Medicare Other | Admitting: Physical Therapy

## 2018-05-11 ENCOUNTER — Other Ambulatory Visit: Payer: Self-pay

## 2018-05-11 ENCOUNTER — Encounter: Payer: Self-pay | Admitting: Physical Therapy

## 2018-05-11 DIAGNOSIS — R262 Difficulty in walking, not elsewhere classified: Secondary | ICD-10-CM

## 2018-05-11 DIAGNOSIS — M6281 Muscle weakness (generalized): Secondary | ICD-10-CM
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
## 2018-05-11 NOTE — Patient Instructions (Signed)
Access Code: [email or web address]  URL: [email or web address]  Date: 05/11/2018  Prepared by: Manus Gunning   Exercises  Seated Eccentric Ankle Plantar Flexion with Resistance - Straight Leg - 10 reps - 1 sets - 1x daily - 7x weekly  Ankle Inversion with Resistance - 10 reps - 1 sets - 1x daily - 7x weekly  Ankle Eversion with Resistance - 10 reps - 1 sets - 1x daily - 7x weekly

## 2018-05-13 ENCOUNTER — Ambulatory Visit: Payer: Medicare Other | Admitting: Physical Therapy

## 2018-05-13 ENCOUNTER — Encounter

## 2018-05-13 ENCOUNTER — Encounter: Payer: Self-pay | Admitting: Physical Therapy

## 2018-05-13 DIAGNOSIS — R262 Difficulty in walking, not elsewhere classified: Secondary | ICD-10-CM | POA: Diagnosis not present

## 2018-05-13 DIAGNOSIS — M6281 Muscle weakness (generalized): Secondary | ICD-10-CM
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
## 2018-05-19 ENCOUNTER — Ambulatory Visit: Payer: Medicare Other | Attending: Oncology | Admitting: Physical Therapy

## 2018-05-19 ENCOUNTER — Encounter: Payer: Self-pay | Admitting: Physical Therapy

## 2018-05-19 ENCOUNTER — Other Ambulatory Visit: Payer: Self-pay

## 2018-05-19 DIAGNOSIS — M6281 Muscle weakness (generalized): Secondary | ICD-10-CM | POA: Diagnosis not present

## 2018-05-19 DIAGNOSIS — R262 Difficulty in walking, not elsewhere classified: Secondary | ICD-10-CM | POA: Insufficient documentation
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
## 2018-05-22 ENCOUNTER — Encounter: Payer: Self-pay | Admitting: Physical Therapy

## 2018-05-22 ENCOUNTER — Ambulatory Visit: Payer: Medicare Other | Admitting: Physical Therapy

## 2018-05-22 ENCOUNTER — Other Ambulatory Visit: Payer: Self-pay

## 2018-05-22 DIAGNOSIS — M6281 Muscle weakness (generalized): Secondary | ICD-10-CM | POA: Diagnosis not present

## 2018-05-22 DIAGNOSIS — R262 Difficulty in walking, not elsewhere classified: Secondary | ICD-10-CM | POA: Diagnosis not present
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
## 2018-05-25 ENCOUNTER — Encounter: Payer: Medicare Other | Admitting: Physical Therapy

## 2018-05-27 ENCOUNTER — Encounter

## 2018-05-29 ENCOUNTER — Encounter: Payer: Self-pay | Admitting: Physical Therapy

## 2018-05-29 ENCOUNTER — Ambulatory Visit: Payer: Medicare Other | Admitting: Physical Therapy

## 2018-05-29 ENCOUNTER — Other Ambulatory Visit: Payer: Self-pay

## 2018-05-29 DIAGNOSIS — M6281 Muscle weakness (generalized): Secondary | ICD-10-CM

## 2018-05-29 DIAGNOSIS — R262 Difficulty in walking, not elsewhere classified: Secondary | ICD-10-CM | POA: Diagnosis not present
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
## 2018-06-02 ENCOUNTER — Other Ambulatory Visit: Payer: Self-pay

## 2018-06-02 ENCOUNTER — Ambulatory Visit: Payer: Medicare Other | Admitting: Physical Therapy

## 2018-06-02 ENCOUNTER — Encounter: Payer: Self-pay | Admitting: Physical Therapy

## 2018-06-02 DIAGNOSIS — M6281 Muscle weakness (generalized): Secondary | ICD-10-CM

## 2018-06-02 DIAGNOSIS — R262 Difficulty in walking, not elsewhere classified: Secondary | ICD-10-CM
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
## 2018-06-05 ENCOUNTER — Encounter: Payer: Self-pay | Admitting: Physical Therapy

## 2018-06-05 ENCOUNTER — Other Ambulatory Visit: Payer: Self-pay

## 2018-06-05 ENCOUNTER — Ambulatory Visit: Payer: Medicare Other | Admitting: Physical Therapy

## 2018-06-05 DIAGNOSIS — M6281 Muscle weakness (generalized): Secondary | ICD-10-CM | POA: Diagnosis not present

## 2018-06-05 DIAGNOSIS — R262 Difficulty in walking, not elsewhere classified: Secondary | ICD-10-CM
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
## 2018-06-08 ENCOUNTER — Ambulatory Visit: Payer: Medicare Other | Admitting: Physical Therapy

## 2018-06-08 ENCOUNTER — Encounter: Payer: Self-pay | Admitting: Physical Therapy

## 2018-06-08 DIAGNOSIS — R262 Difficulty in walking, not elsewhere classified: Secondary | ICD-10-CM

## 2018-06-08 DIAGNOSIS — M6281 Muscle weakness (generalized): Secondary | ICD-10-CM
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
## 2018-06-19 ENCOUNTER — Ambulatory Visit: Payer: Medicare Other | Admitting: Physical Therapy

## 2018-06-23 ENCOUNTER — Encounter: Payer: Medicare Other | Admitting: Physical Therapy

## 2018-06-29 ENCOUNTER — Encounter: Payer: Medicare Other | Admitting: Physical Therapy

## 2018-07-13 DIAGNOSIS — M859 Disorder of bone density and structure, unspecified: Secondary | ICD-10-CM | POA: Diagnosis not present

## 2018-07-13 DIAGNOSIS — E119 Type 2 diabetes mellitus without complications: Secondary | ICD-10-CM | POA: Diagnosis not present

## 2018-07-13 DIAGNOSIS — E7849 Other hyperlipidemia: Secondary | ICD-10-CM | POA: Diagnosis not present

## 2018-07-13 DIAGNOSIS — I10 Essential (primary) hypertension: Secondary | ICD-10-CM | POA: Diagnosis not present

## 2018-07-13 DIAGNOSIS — R82998 Other abnormal findings in urine: Secondary | ICD-10-CM | POA: Diagnosis not present

## 2018-07-20 DIAGNOSIS — Z1212 Encounter for screening for malignant neoplasm of rectum: Secondary | ICD-10-CM | POA: Diagnosis not present

## 2018-07-20 DIAGNOSIS — E119 Type 2 diabetes mellitus without complications: Secondary | ICD-10-CM | POA: Diagnosis not present

## 2018-07-20 DIAGNOSIS — M1990 Unspecified osteoarthritis, unspecified site: Secondary | ICD-10-CM | POA: Diagnosis not present

## 2018-07-20 DIAGNOSIS — Z6828 Body mass index (BMI) 28.0-28.9, adult: Secondary | ICD-10-CM | POA: Diagnosis not present

## 2018-07-20 DIAGNOSIS — Z1331 Encounter for screening for depression: Secondary | ICD-10-CM | POA: Diagnosis not present

## 2018-07-20 DIAGNOSIS — Z0000 Encounter for general adult medical examination without abnormal findings: Secondary | ICD-10-CM | POA: Diagnosis not present

## 2018-07-20 DIAGNOSIS — E7849 Other hyperlipidemia: Secondary | ICD-10-CM | POA: Diagnosis not present

## 2018-07-20 DIAGNOSIS — M8580 Other specified disorders of bone density and structure, unspecified site: Secondary | ICD-10-CM | POA: Diagnosis not present

## 2018-07-20 DIAGNOSIS — E668 Other obesity: Secondary | ICD-10-CM | POA: Diagnosis not present

## 2018-07-20 DIAGNOSIS — C50919 Malignant neoplasm of unspecified site of unspecified female breast: Secondary | ICD-10-CM | POA: Diagnosis not present

## 2018-07-20 DIAGNOSIS — Z1339 Encounter for screening examination for other mental health and behavioral disorders: Secondary | ICD-10-CM | POA: Diagnosis not present

## 2018-07-20 DIAGNOSIS — I10 Essential (primary) hypertension: Secondary | ICD-10-CM | POA: Diagnosis not present

## 2019-01-04 DIAGNOSIS — H401131 Primary open-angle glaucoma, bilateral, mild stage: Secondary | ICD-10-CM | POA: Diagnosis not present

## 2019-01-04 DIAGNOSIS — H2513 Age-related nuclear cataract, bilateral: Secondary | ICD-10-CM | POA: Diagnosis not present

## 2019-01-14 DIAGNOSIS — H401113 Primary open-angle glaucoma, right eye, severe stage: Secondary | ICD-10-CM | POA: Diagnosis not present

## 2019-02-03 DIAGNOSIS — Z23 Encounter for immunization: Secondary | ICD-10-CM | POA: Diagnosis not present

## 2019-02-25 DIAGNOSIS — H401113 Primary open-angle glaucoma, right eye, severe stage: Secondary | ICD-10-CM | POA: Diagnosis not present

## 2019-02-25 DIAGNOSIS — H401121 Primary open-angle glaucoma, left eye, mild stage: Secondary | ICD-10-CM | POA: Diagnosis not present

## 2019-02-25 DIAGNOSIS — H2513 Age-related nuclear cataract, bilateral: Secondary | ICD-10-CM | POA: Diagnosis not present

## 2019-03-08 ENCOUNTER — Other Ambulatory Visit: Payer: Self-pay | Admitting: Internal Medicine

## 2019-03-08 ENCOUNTER — Other Ambulatory Visit: Payer: Self-pay | Admitting: Oncology

## 2019-03-08 DIAGNOSIS — Z1231 Encounter for screening mammogram for malignant neoplasm of breast: Secondary | ICD-10-CM

## 2019-04-22 ENCOUNTER — Ambulatory Visit
Admission: RE | Admit: 2019-04-22 | Discharge: 2019-04-22 | Disposition: A | Discharge status: Home / Self Care | Payer: Medicare Other | Source: Ambulatory Visit | Attending: Internal Medicine | Admitting: Internal Medicine

## 2019-04-22 ENCOUNTER — Other Ambulatory Visit: Payer: Self-pay

## 2019-04-22 DIAGNOSIS — Z1231 Encounter for screening mammogram for malignant neoplasm of breast: Secondary | ICD-10-CM

## 2019-04-27 ENCOUNTER — Other Ambulatory Visit: Payer: Self-pay | Admitting: Internal Medicine

## 2019-04-27 DIAGNOSIS — R928 Other abnormal and inconclusive findings on diagnostic imaging of breast: Secondary | ICD-10-CM

## 2019-04-28 ENCOUNTER — Ambulatory Visit
Admission: RE | Admit: 2019-04-28 | Discharge: 2019-04-28 | Disposition: A | Discharge status: Home / Self Care | Payer: Medicare Other | Source: Ambulatory Visit | Attending: Internal Medicine | Admitting: Internal Medicine

## 2019-04-28 ENCOUNTER — Ambulatory Visit: Payer: Medicare Other

## 2019-04-28 ENCOUNTER — Other Ambulatory Visit: Payer: Medicare Other

## 2019-04-28 ENCOUNTER — Other Ambulatory Visit: Payer: Self-pay

## 2019-04-28 DIAGNOSIS — R928 Other abnormal and inconclusive findings on diagnostic imaging of breast: Secondary | ICD-10-CM | POA: Diagnosis not present

## 2019-08-02 DIAGNOSIS — E119 Type 2 diabetes mellitus without complications: Secondary | ICD-10-CM | POA: Diagnosis not present

## 2019-08-02 DIAGNOSIS — E7849 Other hyperlipidemia: Secondary | ICD-10-CM | POA: Diagnosis not present

## 2019-08-02 DIAGNOSIS — M859 Disorder of bone density and structure, unspecified: Secondary | ICD-10-CM | POA: Diagnosis not present

## 2019-08-06 DIAGNOSIS — I10 Essential (primary) hypertension: Secondary | ICD-10-CM | POA: Diagnosis not present

## 2019-08-06 DIAGNOSIS — R82998 Other abnormal findings in urine: Secondary | ICD-10-CM | POA: Diagnosis not present

## 2019-08-09 DIAGNOSIS — Z1331 Encounter for screening for depression: Secondary | ICD-10-CM | POA: Diagnosis not present

## 2019-08-09 DIAGNOSIS — Z0000 Encounter for general adult medical examination without abnormal findings: Secondary | ICD-10-CM | POA: Diagnosis not present

## 2019-08-09 DIAGNOSIS — E119 Type 2 diabetes mellitus without complications: Secondary | ICD-10-CM | POA: Diagnosis not present

## 2019-08-09 DIAGNOSIS — M1990 Unspecified osteoarthritis, unspecified site: Secondary | ICD-10-CM | POA: Diagnosis not present

## 2019-08-09 DIAGNOSIS — R7989 Other specified abnormal findings of blood chemistry: Secondary | ICD-10-CM | POA: Diagnosis not present

## 2019-08-09 DIAGNOSIS — R195 Other fecal abnormalities: Secondary | ICD-10-CM | POA: Diagnosis not present

## 2019-08-09 DIAGNOSIS — E785 Hyperlipidemia, unspecified: Secondary | ICD-10-CM | POA: Diagnosis not present

## 2019-08-09 DIAGNOSIS — H40009 Preglaucoma, unspecified, unspecified eye: Secondary | ICD-10-CM | POA: Diagnosis not present

## 2019-08-09 DIAGNOSIS — I10 Essential (primary) hypertension: Secondary | ICD-10-CM | POA: Diagnosis not present

## 2019-08-09 DIAGNOSIS — M8580 Other specified disorders of bone density and structure, unspecified site: Secondary | ICD-10-CM | POA: Diagnosis not present

## 2019-08-09 DIAGNOSIS — E669 Obesity, unspecified: Secondary | ICD-10-CM | POA: Diagnosis not present

## 2019-08-09 DIAGNOSIS — M792 Neuralgia and neuritis, unspecified: Secondary | ICD-10-CM | POA: Diagnosis not present

## 2019-08-26 DIAGNOSIS — K5730 Diverticulosis of large intestine without perforation or abscess without bleeding: Secondary | ICD-10-CM | POA: Diagnosis not present

## 2019-08-26 DIAGNOSIS — Z1211 Encounter for screening for malignant neoplasm of colon: Secondary | ICD-10-CM | POA: Diagnosis not present

## 2019-08-26 DIAGNOSIS — R748 Abnormal levels of other serum enzymes: Secondary | ICD-10-CM | POA: Diagnosis not present

## 2019-08-26 DIAGNOSIS — Z800 Family history of malignant neoplasm of digestive organs: Secondary | ICD-10-CM | POA: Diagnosis not present

## 2019-09-01 DIAGNOSIS — H401121 Primary open-angle glaucoma, left eye, mild stage: Secondary | ICD-10-CM | POA: Diagnosis not present

## 2019-09-01 DIAGNOSIS — H401113 Primary open-angle glaucoma, right eye, severe stage: Secondary | ICD-10-CM | POA: Diagnosis not present

## 2019-09-01 DIAGNOSIS — H2513 Age-related nuclear cataract, bilateral: Secondary | ICD-10-CM | POA: Diagnosis not present

## 2019-09-21 DIAGNOSIS — R7989 Other specified abnormal findings of blood chemistry: Secondary | ICD-10-CM | POA: Diagnosis not present

## 2019-10-15 DIAGNOSIS — Z1211 Encounter for screening for malignant neoplasm of colon: Secondary | ICD-10-CM | POA: Diagnosis not present

## 2019-10-15 DIAGNOSIS — K5730 Diverticulosis of large intestine without perforation or abscess without bleeding: Secondary | ICD-10-CM | POA: Diagnosis not present

## 2019-11-08 IMAGING — CR DG LUMBAR SPINE 1V
1 series · 1 of 1 positions shown · non-contrast
Comparison: None.

CLINICAL DATA: Localization image for PLIF

EXAM:
LUMBAR SPINE - 1 VIEW

[lateral]
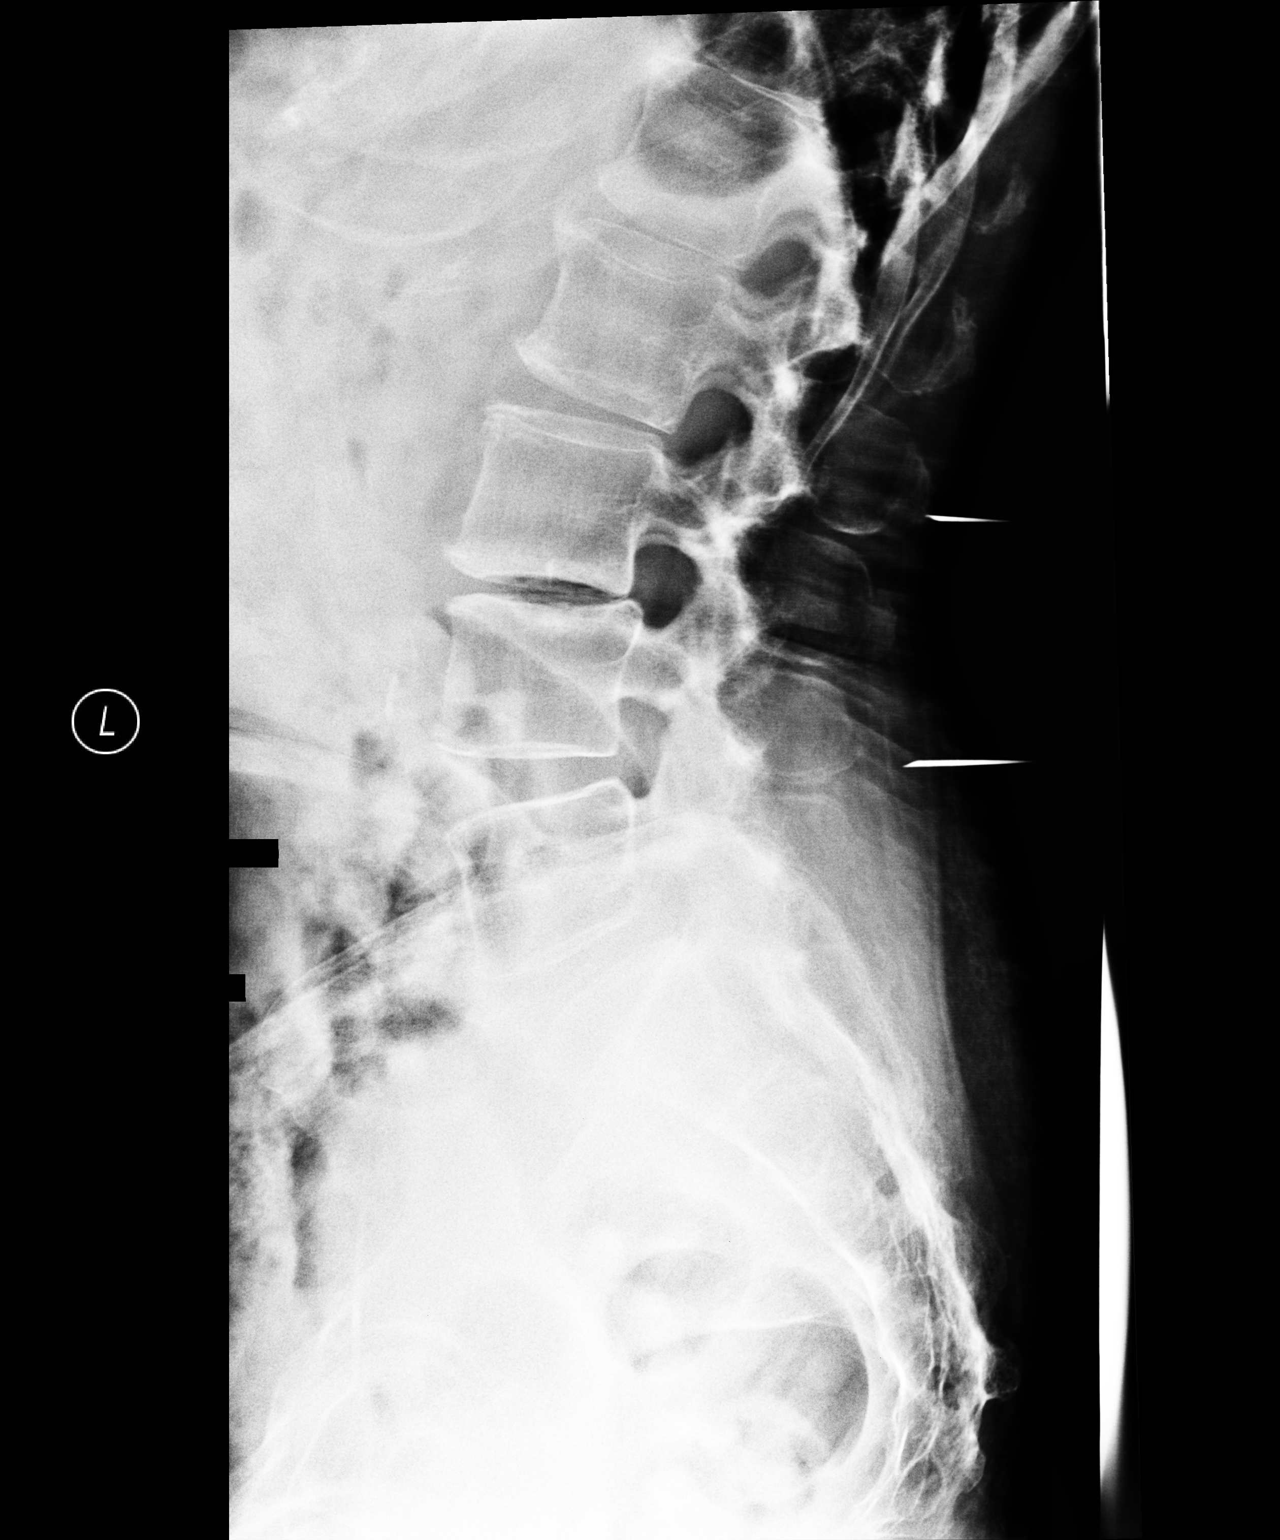

[1 of 1 positions shown; findings below may reference images not displayed]

FINDINGS: Single lateral view of the spine. There are 2 linear localizing
instruments, the more cephalad instrument points to L3. The more
inferior instrument points to the L4-L5 disc level. Degenerative
changes at L3-L4.
IMPRESSION: Lateral view of the spine for localization prior to spine surgery.

## 2020-02-29 DIAGNOSIS — Z9889 Other specified postprocedural states: Secondary | ICD-10-CM | POA: Diagnosis not present

## 2020-02-29 DIAGNOSIS — H25813 Combined forms of age-related cataract, bilateral: Secondary | ICD-10-CM | POA: Diagnosis not present

## 2020-02-29 DIAGNOSIS — H401121 Primary open-angle glaucoma, left eye, mild stage: Secondary | ICD-10-CM | POA: Diagnosis not present

## 2020-02-29 DIAGNOSIS — H04123 Dry eye syndrome of bilateral lacrimal glands: Secondary | ICD-10-CM | POA: Diagnosis not present

## 2020-02-29 DIAGNOSIS — H401113 Primary open-angle glaucoma, right eye, severe stage: Secondary | ICD-10-CM | POA: Diagnosis not present

## 2020-03-03 DIAGNOSIS — Z23 Encounter for immunization: Secondary | ICD-10-CM | POA: Diagnosis not present

## 2020-03-20 ENCOUNTER — Other Ambulatory Visit: Payer: Self-pay | Admitting: Internal Medicine

## 2020-03-20 DIAGNOSIS — Z1231 Encounter for screening mammogram for malignant neoplasm of breast: Secondary | ICD-10-CM

## 2020-04-19 DIAGNOSIS — Z23 Encounter for immunization: Secondary | ICD-10-CM | POA: Diagnosis not present

## 2020-04-24 DIAGNOSIS — H401121 Primary open-angle glaucoma, left eye, mild stage: Secondary | ICD-10-CM | POA: Diagnosis not present

## 2020-04-24 DIAGNOSIS — H401113 Primary open-angle glaucoma, right eye, severe stage: Secondary | ICD-10-CM | POA: Diagnosis not present

## 2020-04-24 DIAGNOSIS — H04123 Dry eye syndrome of bilateral lacrimal glands: Secondary | ICD-10-CM | POA: Diagnosis not present

## 2020-04-24 DIAGNOSIS — Z9889 Other specified postprocedural states: Secondary | ICD-10-CM | POA: Diagnosis not present

## 2020-04-24 DIAGNOSIS — H25813 Combined forms of age-related cataract, bilateral: Secondary | ICD-10-CM | POA: Diagnosis not present

## 2020-04-28 ENCOUNTER — Other Ambulatory Visit: Payer: Self-pay

## 2020-04-28 ENCOUNTER — Ambulatory Visit
Admission: RE | Admit: 2020-04-28 | Discharge: 2020-04-28 | Disposition: A | Discharge status: Home / Self Care | Payer: Medicare Other | Source: Ambulatory Visit | Attending: Internal Medicine | Admitting: Internal Medicine

## 2020-04-28 DIAGNOSIS — Z1231 Encounter for screening mammogram for malignant neoplasm of breast: Secondary | ICD-10-CM

## 2020-06-07 ENCOUNTER — Ambulatory Visit
Admission: RE | Admit: 2020-06-07 | Discharge: 2020-06-07 | Disposition: A | Discharge status: Home / Self Care | Payer: Medicare Other | Source: Ambulatory Visit | Attending: Internal Medicine | Admitting: Internal Medicine

## 2020-06-07 ENCOUNTER — Other Ambulatory Visit: Payer: Self-pay

## 2020-06-07 DIAGNOSIS — Z9889 Other specified postprocedural states: Secondary | ICD-10-CM | POA: Diagnosis not present

## 2020-06-07 DIAGNOSIS — H401121 Primary open-angle glaucoma, left eye, mild stage: Secondary | ICD-10-CM | POA: Diagnosis not present

## 2020-06-07 DIAGNOSIS — H04123 Dry eye syndrome of bilateral lacrimal glands: Secondary | ICD-10-CM | POA: Diagnosis not present

## 2020-06-07 DIAGNOSIS — H401113 Primary open-angle glaucoma, right eye, severe stage: Secondary | ICD-10-CM | POA: Diagnosis not present

## 2020-06-07 DIAGNOSIS — H25813 Combined forms of age-related cataract, bilateral: Secondary | ICD-10-CM | POA: Diagnosis not present

## 2020-06-07 DIAGNOSIS — Z1231 Encounter for screening mammogram for malignant neoplasm of breast: Secondary | ICD-10-CM | POA: Diagnosis not present

## 2020-06-18 IMAGING — MG DIGITAL SCREENING UNILATERAL RIGHT MAMMOGRAM WITH CAD AND TOMO
4 series · 4 of 12 positions shown · non-contrast
Comparison: Previous exam(s).

CLINICAL DATA: Screening.

EXAM:
DIGITAL SCREENING UNILATERAL RIGHT MAMMOGRAM WITH CAD AND TOMO

[R MLO synth-2D]
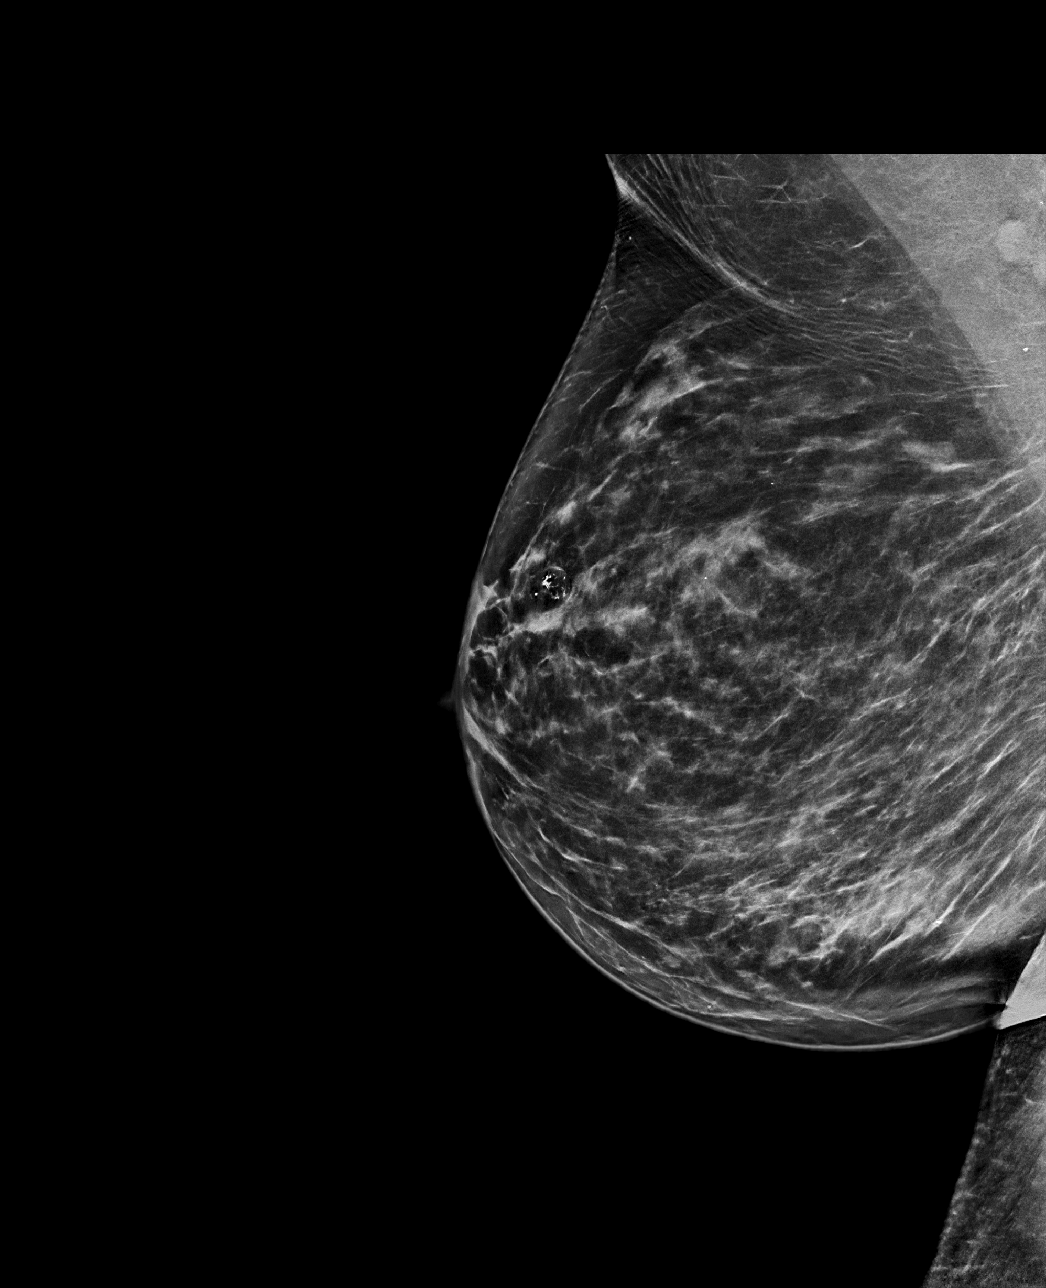

[R CC synth-2D]
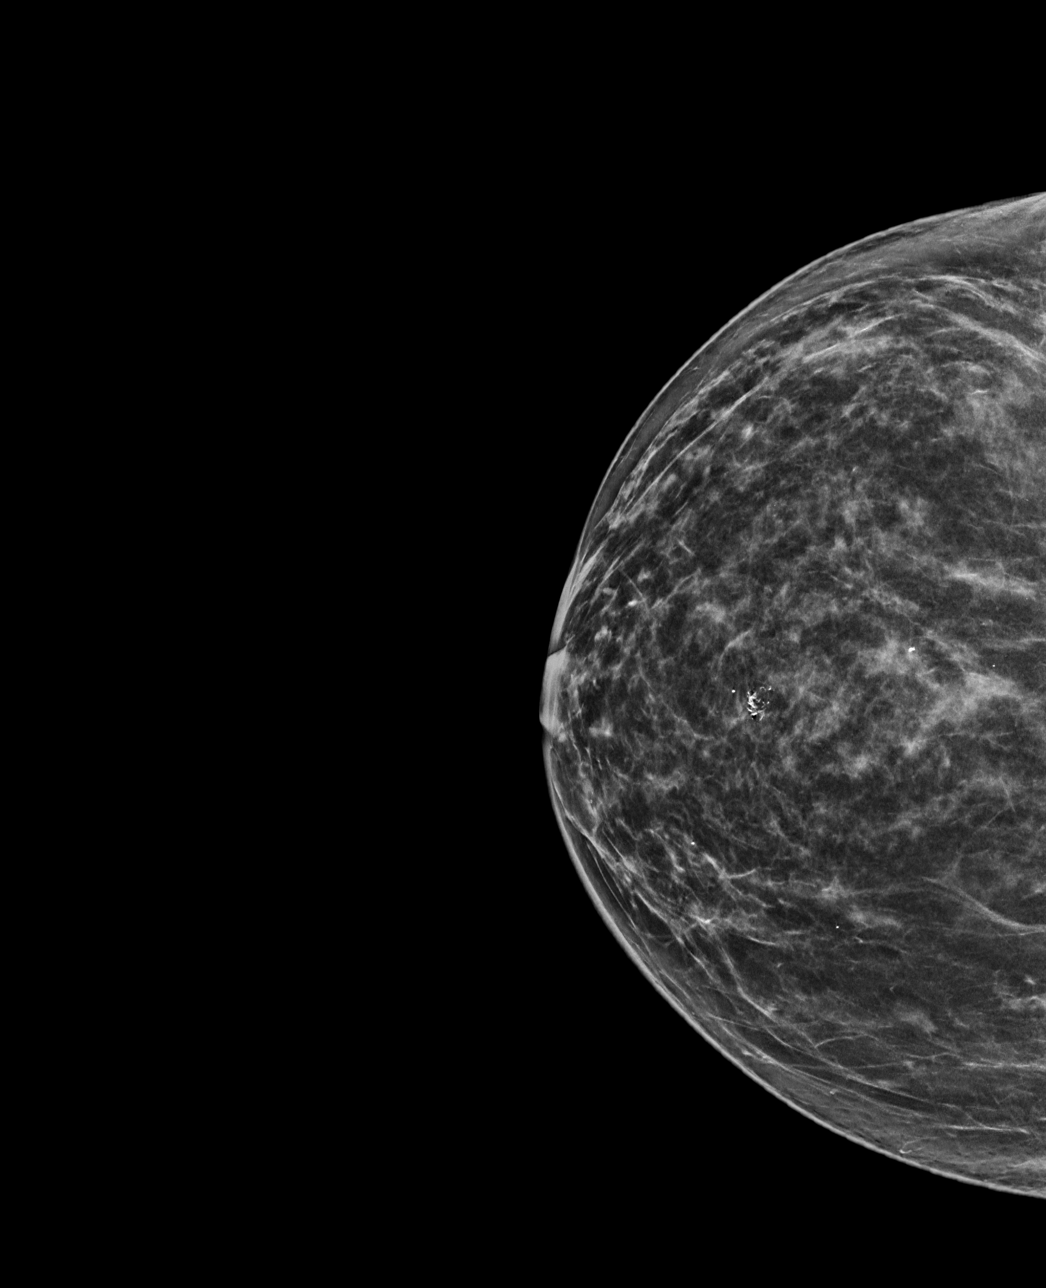

[R MLO tomo · tomo slice 43/86.0]
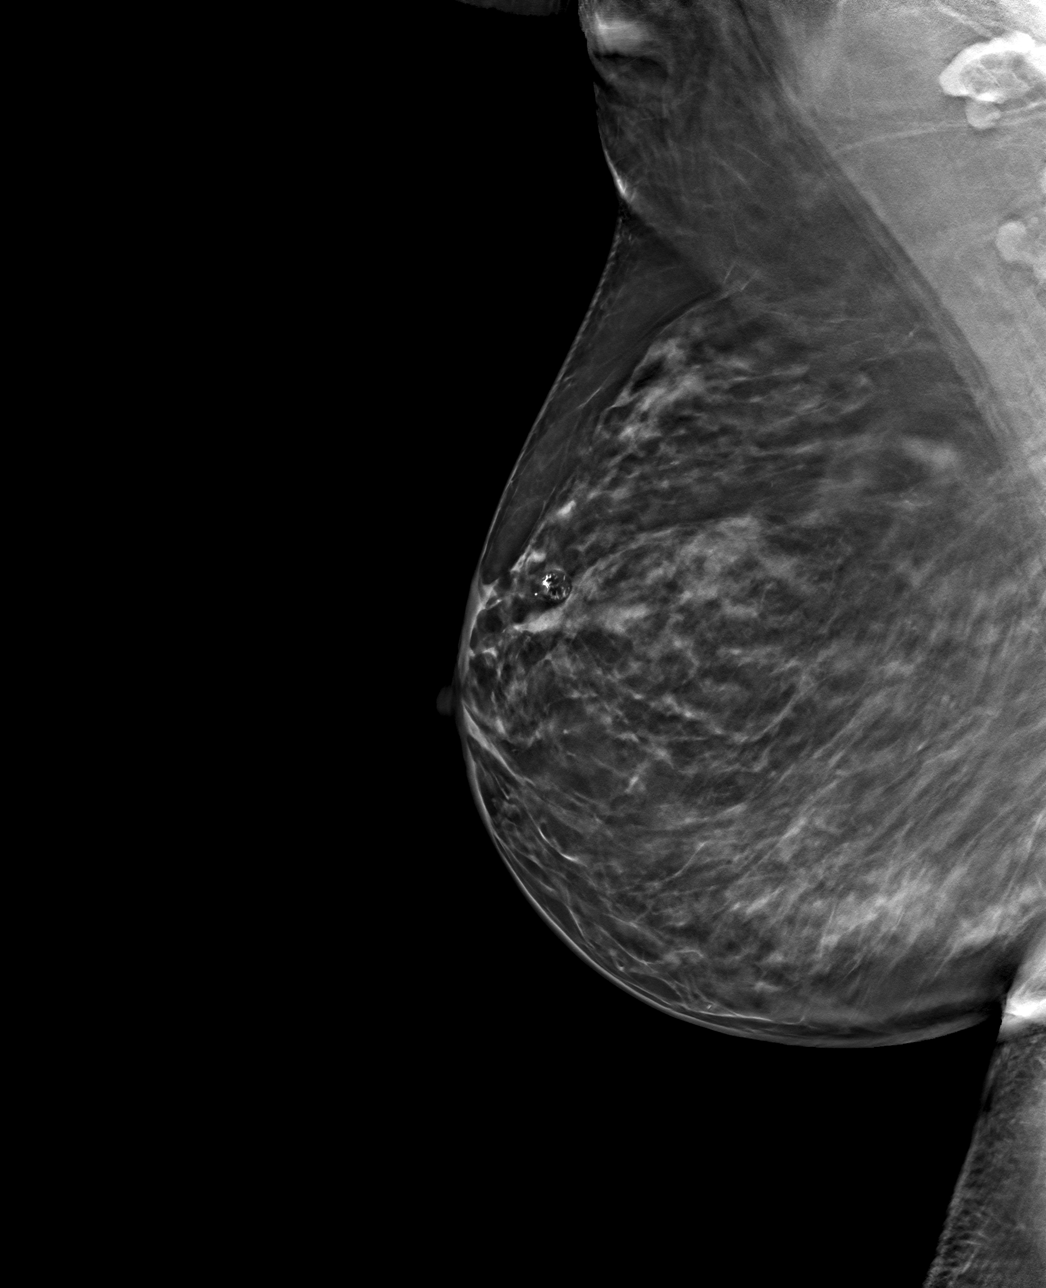

[R CC tomo · tomo slice 36/71.0]
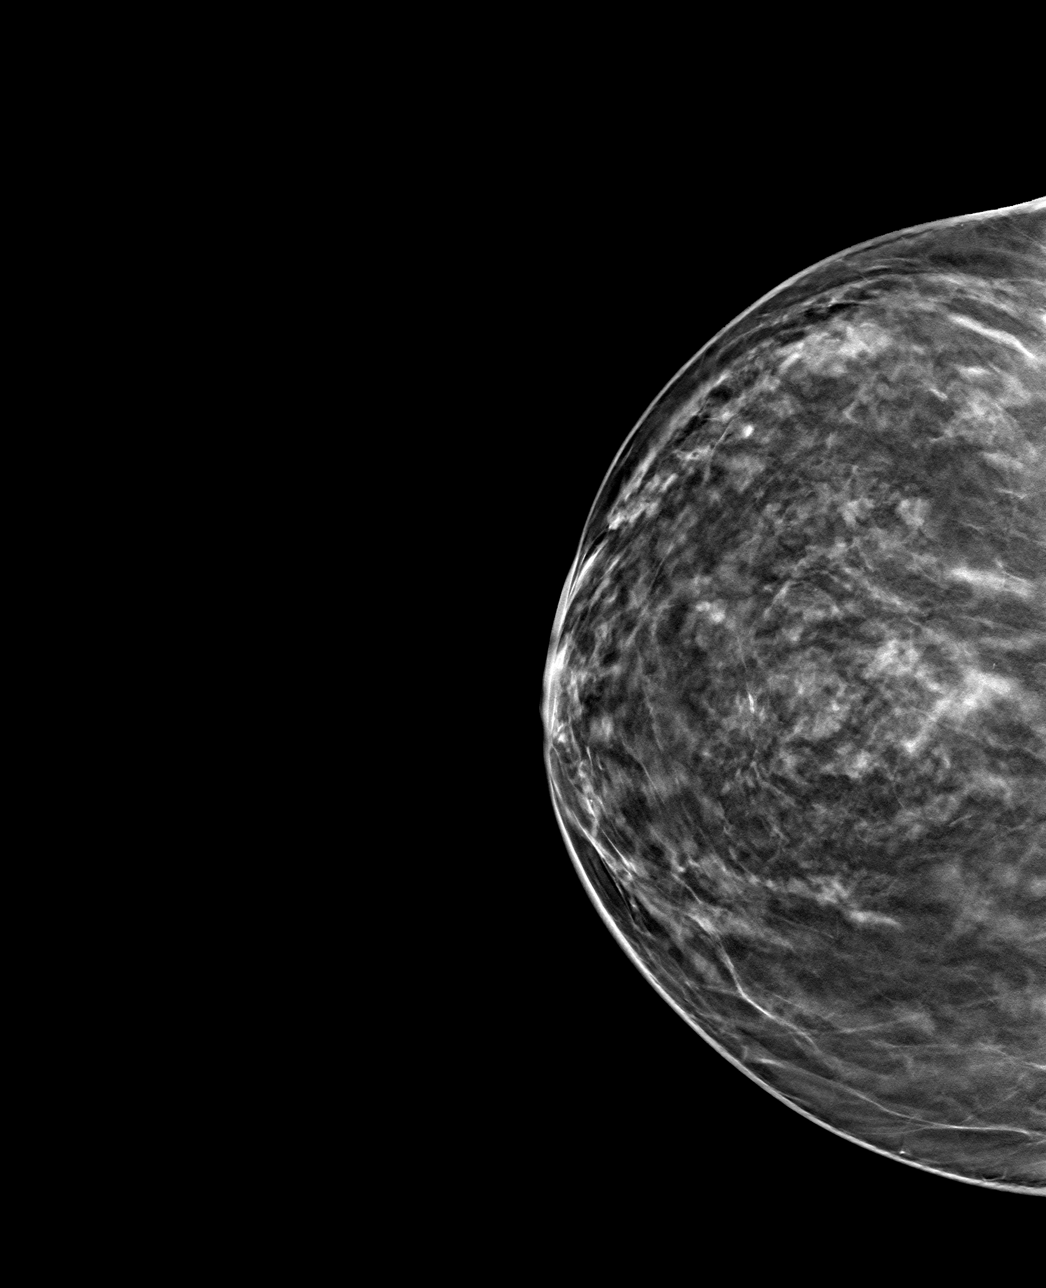

[4 of 12 positions shown; findings below may reference images not displayed]

ACR Breast Density Category c: The breast tissue is heterogeneously
dense, which may obscure small masses.
FINDINGS: There are no findings suspicious for malignancy. Images were
processed with CAD.
IMPRESSION: No mammographic evidence of malignancy. A result letter of this
screening mammogram will be mailed directly to the patient.

RECOMMENDATION:
Screening mammogram in one year. (Code:[id number])

BI-RADS CATEGORY  1: Negative.

## 2020-08-03 DIAGNOSIS — E785 Hyperlipidemia, unspecified: Secondary | ICD-10-CM | POA: Diagnosis not present

## 2020-08-03 DIAGNOSIS — E119 Type 2 diabetes mellitus without complications: Secondary | ICD-10-CM | POA: Diagnosis not present

## 2020-08-10 DIAGNOSIS — Z0000 Encounter for general adult medical examination without abnormal findings: Secondary | ICD-10-CM | POA: Diagnosis not present

## 2020-08-10 DIAGNOSIS — Z1339 Encounter for screening examination for other mental health and behavioral disorders: Secondary | ICD-10-CM | POA: Diagnosis not present

## 2020-08-10 DIAGNOSIS — R7989 Other specified abnormal findings of blood chemistry: Secondary | ICD-10-CM | POA: Diagnosis not present

## 2020-08-10 DIAGNOSIS — M8580 Other specified disorders of bone density and structure, unspecified site: Secondary | ICD-10-CM | POA: Diagnosis not present

## 2020-08-10 DIAGNOSIS — E785 Hyperlipidemia, unspecified: Secondary | ICD-10-CM | POA: Diagnosis not present

## 2020-08-10 DIAGNOSIS — I10 Essential (primary) hypertension: Secondary | ICD-10-CM | POA: Diagnosis not present

## 2020-08-10 DIAGNOSIS — H409 Unspecified glaucoma: Secondary | ICD-10-CM | POA: Diagnosis not present

## 2020-08-10 DIAGNOSIS — M792 Neuralgia and neuritis, unspecified: Secondary | ICD-10-CM | POA: Diagnosis not present

## 2020-08-10 DIAGNOSIS — Z1331 Encounter for screening for depression: Secondary | ICD-10-CM | POA: Diagnosis not present

## 2020-08-10 DIAGNOSIS — E119 Type 2 diabetes mellitus without complications: Secondary | ICD-10-CM | POA: Diagnosis not present

## 2020-08-10 DIAGNOSIS — E669 Obesity, unspecified: Secondary | ICD-10-CM | POA: Diagnosis not present

## 2020-08-10 DIAGNOSIS — R82998 Other abnormal findings in urine: Secondary | ICD-10-CM | POA: Diagnosis not present

## 2020-09-07 DIAGNOSIS — M8589 Other specified disorders of bone density and structure, multiple sites: Secondary | ICD-10-CM | POA: Diagnosis not present

## 2020-10-11 DIAGNOSIS — H25813 Combined forms of age-related cataract, bilateral: Secondary | ICD-10-CM | POA: Diagnosis not present

## 2020-10-11 DIAGNOSIS — H04123 Dry eye syndrome of bilateral lacrimal glands: Secondary | ICD-10-CM | POA: Diagnosis not present

## 2020-10-11 DIAGNOSIS — H401113 Primary open-angle glaucoma, right eye, severe stage: Secondary | ICD-10-CM | POA: Diagnosis not present

## 2020-10-11 DIAGNOSIS — Z9889 Other specified postprocedural states: Secondary | ICD-10-CM | POA: Diagnosis not present

## 2020-10-11 DIAGNOSIS — H401121 Primary open-angle glaucoma, left eye, mild stage: Secondary | ICD-10-CM | POA: Diagnosis not present

## 2020-10-23 DIAGNOSIS — Z23 Encounter for immunization: Secondary | ICD-10-CM | POA: Diagnosis not present

## 2020-10-27 DIAGNOSIS — E785 Hyperlipidemia, unspecified: Secondary | ICD-10-CM | POA: Diagnosis not present

## 2021-02-12 DIAGNOSIS — E669 Obesity, unspecified: Secondary | ICD-10-CM | POA: Diagnosis not present

## 2021-02-12 DIAGNOSIS — M1990 Unspecified osteoarthritis, unspecified site: Secondary | ICD-10-CM | POA: Diagnosis not present

## 2021-02-12 DIAGNOSIS — H04123 Dry eye syndrome of bilateral lacrimal glands: Secondary | ICD-10-CM | POA: Diagnosis not present

## 2021-02-12 DIAGNOSIS — M792 Neuralgia and neuritis, unspecified: Secondary | ICD-10-CM | POA: Diagnosis not present

## 2021-02-12 DIAGNOSIS — H401121 Primary open-angle glaucoma, left eye, mild stage: Secondary | ICD-10-CM | POA: Diagnosis not present

## 2021-02-12 DIAGNOSIS — H401113 Primary open-angle glaucoma, right eye, severe stage: Secondary | ICD-10-CM | POA: Diagnosis not present

## 2021-02-12 DIAGNOSIS — E785 Hyperlipidemia, unspecified: Secondary | ICD-10-CM | POA: Diagnosis not present

## 2021-02-12 DIAGNOSIS — E119 Type 2 diabetes mellitus without complications: Secondary | ICD-10-CM | POA: Diagnosis not present

## 2021-02-12 DIAGNOSIS — I10 Essential (primary) hypertension: Secondary | ICD-10-CM | POA: Diagnosis not present

## 2021-02-12 DIAGNOSIS — Z9889 Other specified postprocedural states: Secondary | ICD-10-CM | POA: Diagnosis not present

## 2021-02-12 DIAGNOSIS — R7989 Other specified abnormal findings of blood chemistry: Secondary | ICD-10-CM | POA: Diagnosis not present

## 2021-02-12 DIAGNOSIS — H25813 Combined forms of age-related cataract, bilateral: Secondary | ICD-10-CM | POA: Diagnosis not present

## 2021-02-12 DIAGNOSIS — C50919 Malignant neoplasm of unspecified site of unspecified female breast: Secondary | ICD-10-CM | POA: Diagnosis not present

## 2021-02-12 DIAGNOSIS — Z23 Encounter for immunization: Secondary | ICD-10-CM | POA: Diagnosis not present

## 2021-02-12 DIAGNOSIS — H532 Diplopia: Secondary | ICD-10-CM | POA: Diagnosis not present

## 2021-02-12 DIAGNOSIS — M810 Age-related osteoporosis without current pathological fracture: Secondary | ICD-10-CM | POA: Diagnosis not present

## 2021-03-07 DIAGNOSIS — Z23 Encounter for immunization: Secondary | ICD-10-CM | POA: Diagnosis not present

## 2021-03-18 DIAGNOSIS — M79602 Pain in left arm: Secondary | ICD-10-CM | POA: Diagnosis not present

## 2021-03-18 DIAGNOSIS — S52125A Nondisplaced fracture of head of left radius, initial encounter for closed fracture: Secondary | ICD-10-CM | POA: Diagnosis not present

## 2021-03-18 DIAGNOSIS — M25422 Effusion, left elbow: Secondary | ICD-10-CM | POA: Diagnosis not present

## 2021-03-18 DIAGNOSIS — J320 Chronic maxillary sinusitis: Secondary | ICD-10-CM | POA: Diagnosis not present

## 2021-03-18 DIAGNOSIS — W19XXXA Unspecified fall, initial encounter: Secondary | ICD-10-CM | POA: Diagnosis not present

## 2021-03-18 DIAGNOSIS — S0990XA Unspecified injury of head, initial encounter: Secondary | ICD-10-CM | POA: Diagnosis not present

## 2021-03-18 DIAGNOSIS — S4992XA Unspecified injury of left shoulder and upper arm, initial encounter: Secondary | ICD-10-CM | POA: Diagnosis not present

## 2021-03-27 DIAGNOSIS — H25813 Combined forms of age-related cataract, bilateral: Secondary | ICD-10-CM | POA: Diagnosis not present

## 2021-03-27 DIAGNOSIS — Z9889 Other specified postprocedural states: Secondary | ICD-10-CM | POA: Diagnosis not present

## 2021-03-27 DIAGNOSIS — H401113 Primary open-angle glaucoma, right eye, severe stage: Secondary | ICD-10-CM | POA: Diagnosis not present

## 2021-03-27 DIAGNOSIS — H401121 Primary open-angle glaucoma, left eye, mild stage: Secondary | ICD-10-CM | POA: Diagnosis not present

## 2021-03-27 DIAGNOSIS — H04123 Dry eye syndrome of bilateral lacrimal glands: Secondary | ICD-10-CM | POA: Diagnosis not present

## 2021-05-01 ENCOUNTER — Other Ambulatory Visit: Payer: Self-pay | Admitting: Internal Medicine

## 2021-05-01 DIAGNOSIS — Z1231 Encounter for screening mammogram for malignant neoplasm of breast: Secondary | ICD-10-CM

## 2021-06-15 ENCOUNTER — Ambulatory Visit: Payer: Medicare Other

## 2021-06-20 ENCOUNTER — Ambulatory Visit
Admission: RE | Admit: 2021-06-20 | Discharge: 2021-06-20 | Disposition: A | Discharge status: Home / Self Care | Payer: Medicare Other | Source: Ambulatory Visit | Attending: Internal Medicine | Admitting: Internal Medicine

## 2021-06-20 DIAGNOSIS — Z1231 Encounter for screening mammogram for malignant neoplasm of breast: Secondary | ICD-10-CM

## 2021-07-20 DIAGNOSIS — H401121 Primary open-angle glaucoma, left eye, mild stage: Secondary | ICD-10-CM | POA: Diagnosis not present

## 2021-07-20 DIAGNOSIS — H04123 Dry eye syndrome of bilateral lacrimal glands: Secondary | ICD-10-CM | POA: Diagnosis not present

## 2021-07-20 DIAGNOSIS — H401113 Primary open-angle glaucoma, right eye, severe stage: Secondary | ICD-10-CM | POA: Diagnosis not present

## 2021-07-20 DIAGNOSIS — H25813 Combined forms of age-related cataract, bilateral: Secondary | ICD-10-CM | POA: Diagnosis not present

## 2021-08-20 DIAGNOSIS — I10 Essential (primary) hypertension: Secondary | ICD-10-CM | POA: Diagnosis not present

## 2021-08-20 DIAGNOSIS — M810 Age-related osteoporosis without current pathological fracture: Secondary | ICD-10-CM | POA: Diagnosis not present

## 2021-08-20 DIAGNOSIS — E119 Type 2 diabetes mellitus without complications: Secondary | ICD-10-CM | POA: Diagnosis not present

## 2021-08-20 DIAGNOSIS — E785 Hyperlipidemia, unspecified: Secondary | ICD-10-CM | POA: Diagnosis not present

## 2021-08-21 DIAGNOSIS — C50919 Malignant neoplasm of unspecified site of unspecified female breast: Secondary | ICD-10-CM | POA: Diagnosis not present

## 2021-08-21 DIAGNOSIS — I10 Essential (primary) hypertension: Secondary | ICD-10-CM | POA: Diagnosis not present

## 2021-08-21 DIAGNOSIS — Z0000 Encounter for general adult medical examination without abnormal findings: Secondary | ICD-10-CM | POA: Diagnosis not present

## 2021-08-21 DIAGNOSIS — M810 Age-related osteoporosis without current pathological fracture: Secondary | ICD-10-CM | POA: Diagnosis not present

## 2021-08-21 DIAGNOSIS — M1990 Unspecified osteoarthritis, unspecified site: Secondary | ICD-10-CM | POA: Diagnosis not present

## 2021-08-21 DIAGNOSIS — E119 Type 2 diabetes mellitus without complications: Secondary | ICD-10-CM | POA: Diagnosis not present

## 2021-08-21 DIAGNOSIS — M792 Neuralgia and neuritis, unspecified: Secondary | ICD-10-CM | POA: Diagnosis not present

## 2021-08-21 DIAGNOSIS — E669 Obesity, unspecified: Secondary | ICD-10-CM | POA: Diagnosis not present

## 2021-08-21 DIAGNOSIS — E785 Hyperlipidemia, unspecified: Secondary | ICD-10-CM | POA: Diagnosis not present

## 2021-08-21 DIAGNOSIS — R82998 Other abnormal findings in urine: Secondary | ICD-10-CM | POA: Diagnosis not present

## 2021-08-21 DIAGNOSIS — Z1339 Encounter for screening examination for other mental health and behavioral disorders: Secondary | ICD-10-CM | POA: Diagnosis not present

## 2021-08-21 DIAGNOSIS — Z1331 Encounter for screening for depression: Secondary | ICD-10-CM | POA: Diagnosis not present

## 2021-10-05 DIAGNOSIS — H524 Presbyopia: Secondary | ICD-10-CM | POA: Diagnosis not present

## 2021-10-05 DIAGNOSIS — H2513 Age-related nuclear cataract, bilateral: Secondary | ICD-10-CM | POA: Diagnosis not present

## 2021-10-05 DIAGNOSIS — H401131 Primary open-angle glaucoma, bilateral, mild stage: Secondary | ICD-10-CM | POA: Diagnosis not present

## 2021-10-22 DIAGNOSIS — H401131 Primary open-angle glaucoma, bilateral, mild stage: Secondary | ICD-10-CM | POA: Diagnosis not present

## 2022-02-21 DIAGNOSIS — C50919 Malignant neoplasm of unspecified site of unspecified female breast: Secondary | ICD-10-CM | POA: Diagnosis not present

## 2022-02-21 DIAGNOSIS — R7989 Other specified abnormal findings of blood chemistry: Secondary | ICD-10-CM | POA: Diagnosis not present

## 2022-02-21 DIAGNOSIS — E669 Obesity, unspecified: Secondary | ICD-10-CM | POA: Diagnosis not present

## 2022-02-21 DIAGNOSIS — M1990 Unspecified osteoarthritis, unspecified site: Secondary | ICD-10-CM | POA: Diagnosis not present

## 2022-02-21 DIAGNOSIS — E119 Type 2 diabetes mellitus without complications: Secondary | ICD-10-CM | POA: Diagnosis not present

## 2022-02-21 DIAGNOSIS — M792 Neuralgia and neuritis, unspecified: Secondary | ICD-10-CM | POA: Diagnosis not present

## 2022-02-21 DIAGNOSIS — I10 Essential (primary) hypertension: Secondary | ICD-10-CM | POA: Diagnosis not present

## 2022-02-21 DIAGNOSIS — E785 Hyperlipidemia, unspecified: Secondary | ICD-10-CM | POA: Diagnosis not present

## 2022-02-21 DIAGNOSIS — H532 Diplopia: Secondary | ICD-10-CM | POA: Diagnosis not present

## 2022-02-21 DIAGNOSIS — M810 Age-related osteoporosis without current pathological fracture: Secondary | ICD-10-CM | POA: Diagnosis not present

## 2022-02-21 DIAGNOSIS — H409 Unspecified glaucoma: Secondary | ICD-10-CM | POA: Diagnosis not present

## 2022-03-08 DIAGNOSIS — S62515A Nondisplaced fracture of proximal phalanx of left thumb, initial encounter for closed fracture: Secondary | ICD-10-CM | POA: Diagnosis not present

## 2022-03-12 DIAGNOSIS — Z23 Encounter for immunization: Secondary | ICD-10-CM | POA: Diagnosis not present

## 2022-03-25 DIAGNOSIS — M65312 Trigger thumb, left thumb: Secondary | ICD-10-CM | POA: Diagnosis not present

## 2022-03-25 DIAGNOSIS — M79645 Pain in left finger(s): Secondary | ICD-10-CM | POA: Diagnosis not present

## 2022-04-25 DIAGNOSIS — H401131 Primary open-angle glaucoma, bilateral, mild stage: Secondary | ICD-10-CM | POA: Diagnosis not present

## 2022-05-20 ENCOUNTER — Other Ambulatory Visit: Payer: Self-pay | Admitting: Internal Medicine

## 2022-05-20 DIAGNOSIS — Z1231 Encounter for screening mammogram for malignant neoplasm of breast: Secondary | ICD-10-CM

## 2022-07-12 ENCOUNTER — Ambulatory Visit
Admission: RE | Admit: 2022-07-12 | Discharge: 2022-07-12 | Disposition: A | Discharge status: Home / Self Care | Payer: Medicare Other | Source: Ambulatory Visit | Attending: Internal Medicine | Admitting: Internal Medicine

## 2022-07-12 DIAGNOSIS — Z1231 Encounter for screening mammogram for malignant neoplasm of breast: Secondary | ICD-10-CM

## 2022-08-20 DIAGNOSIS — M810 Age-related osteoporosis without current pathological fracture: Secondary | ICD-10-CM | POA: Diagnosis not present

## 2022-08-20 DIAGNOSIS — E785 Hyperlipidemia, unspecified: Secondary | ICD-10-CM | POA: Diagnosis not present

## 2022-08-20 DIAGNOSIS — E119 Type 2 diabetes mellitus without complications: Secondary | ICD-10-CM | POA: Diagnosis not present

## 2022-08-20 DIAGNOSIS — I10 Essential (primary) hypertension: Secondary | ICD-10-CM | POA: Diagnosis not present

## 2022-09-03 DIAGNOSIS — E119 Type 2 diabetes mellitus without complications: Secondary | ICD-10-CM | POA: Diagnosis not present

## 2022-09-03 DIAGNOSIS — M1990 Unspecified osteoarthritis, unspecified site: Secondary | ICD-10-CM | POA: Diagnosis not present

## 2022-09-03 DIAGNOSIS — M792 Neuralgia and neuritis, unspecified: Secondary | ICD-10-CM | POA: Diagnosis not present

## 2022-09-03 DIAGNOSIS — B359 Dermatophytosis, unspecified: Secondary | ICD-10-CM | POA: Diagnosis not present

## 2022-09-03 DIAGNOSIS — R7989 Other specified abnormal findings of blood chemistry: Secondary | ICD-10-CM | POA: Diagnosis not present

## 2022-09-03 DIAGNOSIS — E669 Obesity, unspecified: Secondary | ICD-10-CM | POA: Diagnosis not present

## 2022-09-03 DIAGNOSIS — E785 Hyperlipidemia, unspecified: Secondary | ICD-10-CM | POA: Diagnosis not present

## 2022-09-03 DIAGNOSIS — I10 Essential (primary) hypertension: Secondary | ICD-10-CM | POA: Diagnosis not present

## 2022-09-03 DIAGNOSIS — C50919 Malignant neoplasm of unspecified site of unspecified female breast: Secondary | ICD-10-CM | POA: Diagnosis not present

## 2022-09-03 DIAGNOSIS — Z0000 Encounter for general adult medical examination without abnormal findings: Secondary | ICD-10-CM | POA: Diagnosis not present

## 2022-09-03 DIAGNOSIS — R82998 Other abnormal findings in urine: Secondary | ICD-10-CM | POA: Diagnosis not present

## 2022-09-03 DIAGNOSIS — M810 Age-related osteoporosis without current pathological fracture: Secondary | ICD-10-CM | POA: Diagnosis not present

## 2022-09-23 DIAGNOSIS — H2513 Age-related nuclear cataract, bilateral: Secondary | ICD-10-CM | POA: Diagnosis not present

## 2022-09-23 DIAGNOSIS — H401131 Primary open-angle glaucoma, bilateral, mild stage: Secondary | ICD-10-CM | POA: Diagnosis not present

## 2022-10-12 DIAGNOSIS — Z23 Encounter for immunization: Secondary | ICD-10-CM | POA: Diagnosis not present

## 2022-11-28 DIAGNOSIS — H401131 Primary open-angle glaucoma, bilateral, mild stage: Secondary | ICD-10-CM | POA: Diagnosis not present

## 2022-11-28 DIAGNOSIS — H2513 Age-related nuclear cataract, bilateral: Secondary | ICD-10-CM | POA: Diagnosis not present

## 2022-11-28 DIAGNOSIS — H5203 Hypermetropia, bilateral: Secondary | ICD-10-CM | POA: Diagnosis not present

## 2023-01-08 DIAGNOSIS — H2512 Age-related nuclear cataract, left eye: Secondary | ICD-10-CM | POA: Diagnosis not present

## 2023-01-08 DIAGNOSIS — H401122 Primary open-angle glaucoma, left eye, moderate stage: Secondary | ICD-10-CM | POA: Diagnosis not present

## 2023-01-08 DIAGNOSIS — H401121 Primary open-angle glaucoma, left eye, mild stage: Secondary | ICD-10-CM | POA: Diagnosis not present

## 2023-01-08 DIAGNOSIS — H25812 Combined forms of age-related cataract, left eye: Secondary | ICD-10-CM | POA: Diagnosis not present

## 2023-01-09 DIAGNOSIS — H259 Unspecified age-related cataract: Secondary | ICD-10-CM | POA: Diagnosis not present

## 2023-01-09 DIAGNOSIS — H409 Unspecified glaucoma: Secondary | ICD-10-CM | POA: Diagnosis not present

## 2023-01-09 DIAGNOSIS — D692 Other nonthrombocytopenic purpura: Secondary | ICD-10-CM | POA: Diagnosis not present

## 2023-01-28 ENCOUNTER — Other Ambulatory Visit: Payer: Self-pay | Admitting: Internal Medicine

## 2023-01-28 DIAGNOSIS — Z1231 Encounter for screening mammogram for malignant neoplasm of breast: Secondary | ICD-10-CM

## 2023-02-19 DIAGNOSIS — H409 Unspecified glaucoma: Secondary | ICD-10-CM | POA: Diagnosis not present

## 2023-02-19 DIAGNOSIS — H269 Unspecified cataract: Secondary | ICD-10-CM | POA: Diagnosis not present

## 2023-02-19 DIAGNOSIS — H2511 Age-related nuclear cataract, right eye: Secondary | ICD-10-CM | POA: Diagnosis not present

## 2023-02-19 DIAGNOSIS — H401111 Primary open-angle glaucoma, right eye, mild stage: Secondary | ICD-10-CM | POA: Diagnosis not present

## 2023-03-18 DIAGNOSIS — M792 Neuralgia and neuritis, unspecified: Secondary | ICD-10-CM | POA: Diagnosis not present

## 2023-03-18 DIAGNOSIS — M810 Age-related osteoporosis without current pathological fracture: Secondary | ICD-10-CM | POA: Diagnosis not present

## 2023-03-18 DIAGNOSIS — E119 Type 2 diabetes mellitus without complications: Secondary | ICD-10-CM | POA: Diagnosis not present

## 2023-03-18 DIAGNOSIS — Z23 Encounter for immunization: Secondary | ICD-10-CM | POA: Diagnosis not present

## 2023-03-18 DIAGNOSIS — Z853 Personal history of malignant neoplasm of breast: Secondary | ICD-10-CM | POA: Diagnosis not present

## 2023-03-18 DIAGNOSIS — B359 Dermatophytosis, unspecified: Secondary | ICD-10-CM | POA: Diagnosis not present

## 2023-03-18 DIAGNOSIS — E669 Obesity, unspecified: Secondary | ICD-10-CM | POA: Diagnosis not present

## 2023-03-18 DIAGNOSIS — M1990 Unspecified osteoarthritis, unspecified site: Secondary | ICD-10-CM | POA: Diagnosis not present

## 2023-03-18 DIAGNOSIS — E785 Hyperlipidemia, unspecified: Secondary | ICD-10-CM | POA: Diagnosis not present

## 2023-03-18 DIAGNOSIS — I10 Essential (primary) hypertension: Secondary | ICD-10-CM | POA: Diagnosis not present

## 2023-04-15 DIAGNOSIS — H401131 Primary open-angle glaucoma, bilateral, mild stage: Secondary | ICD-10-CM | POA: Diagnosis not present

## 2023-05-27 DIAGNOSIS — L814 Other melanin hyperpigmentation: Secondary | ICD-10-CM | POA: Diagnosis not present

## 2023-05-27 DIAGNOSIS — H401131 Primary open-angle glaucoma, bilateral, mild stage: Secondary | ICD-10-CM | POA: Diagnosis not present

## 2023-05-27 DIAGNOSIS — L578 Other skin changes due to chronic exposure to nonionizing radiation: Secondary | ICD-10-CM | POA: Diagnosis not present

## 2023-05-27 DIAGNOSIS — L570 Actinic keratosis: Secondary | ICD-10-CM | POA: Diagnosis not present

## 2023-05-27 DIAGNOSIS — L821 Other seborrheic keratosis: Secondary | ICD-10-CM | POA: Diagnosis not present

## 2023-07-14 ENCOUNTER — Ambulatory Visit
Admission: RE | Admit: 2023-07-14 | Discharge: 2023-07-14 | Disposition: A | Discharge status: Home / Self Care | Payer: Medicare Other | Source: Ambulatory Visit | Attending: Internal Medicine | Admitting: Internal Medicine

## 2023-07-14 DIAGNOSIS — Z1231 Encounter for screening mammogram for malignant neoplasm of breast: Secondary | ICD-10-CM | POA: Diagnosis not present

## 2023-08-21 DIAGNOSIS — H02831 Dermatochalasis of right upper eyelid: Secondary | ICD-10-CM | POA: Diagnosis not present

## 2023-08-22 ENCOUNTER — Other Ambulatory Visit: Payer: Self-pay | Admitting: Oculoplastics Ophthalmology

## 2023-08-22 DIAGNOSIS — I671 Cerebral aneurysm, nonruptured: Secondary | ICD-10-CM

## 2023-08-29 ENCOUNTER — Ambulatory Visit
Admission: RE | Admit: 2023-08-29 | Discharge: 2023-08-29 | Disposition: A | Discharge status: Home / Self Care | Source: Ambulatory Visit | Attending: Oculoplastics Ophthalmology | Admitting: Oculoplastics Ophthalmology

## 2023-08-29 DIAGNOSIS — G9001 Carotid sinus syncope: Secondary | ICD-10-CM | POA: Diagnosis not present

## 2023-08-29 DIAGNOSIS — I672 Cerebral atherosclerosis: Secondary | ICD-10-CM | POA: Diagnosis not present

## 2023-08-29 DIAGNOSIS — I671 Cerebral aneurysm, nonruptured: Secondary | ICD-10-CM | POA: Diagnosis not present

## 2023-08-29 LAB — POCT I-STAT CREATININE: Creatinine, Ser: 0.8 mg/dL (ref 0.44–1.00)

## 2023-08-29 MED ORDER — IOHEXOL 350 MG/ML SOLN
75.0000 mL | Freq: Once | INTRAVENOUS | Status: AC | PRN
  Administered 2023-08-29: 75 mL via INTRAVENOUS

## 2023-09-09 DIAGNOSIS — R7989 Other specified abnormal findings of blood chemistry: Secondary | ICD-10-CM | POA: Diagnosis not present

## 2023-09-09 DIAGNOSIS — E119 Type 2 diabetes mellitus without complications: Secondary | ICD-10-CM | POA: Diagnosis not present

## 2023-09-09 DIAGNOSIS — Z1212 Encounter for screening for malignant neoplasm of rectum: Secondary | ICD-10-CM | POA: Diagnosis not present

## 2023-09-09 DIAGNOSIS — I10 Essential (primary) hypertension: Secondary | ICD-10-CM | POA: Diagnosis not present

## 2023-09-09 DIAGNOSIS — E785 Hyperlipidemia, unspecified: Secondary | ICD-10-CM | POA: Diagnosis not present

## 2023-09-09 DIAGNOSIS — M810 Age-related osteoporosis without current pathological fracture: Secondary | ICD-10-CM | POA: Diagnosis not present

## 2023-09-11 DIAGNOSIS — E785 Hyperlipidemia, unspecified: Secondary | ICD-10-CM | POA: Diagnosis not present

## 2023-09-11 DIAGNOSIS — Z1212 Encounter for screening for malignant neoplasm of rectum: Secondary | ICD-10-CM | POA: Diagnosis not present

## 2023-09-12 DIAGNOSIS — Z0000 Encounter for general adult medical examination without abnormal findings: Secondary | ICD-10-CM | POA: Diagnosis not present

## 2023-09-12 DIAGNOSIS — E119 Type 2 diabetes mellitus without complications: Secondary | ICD-10-CM | POA: Diagnosis not present

## 2023-09-12 DIAGNOSIS — E669 Obesity, unspecified: Secondary | ICD-10-CM | POA: Diagnosis not present

## 2023-09-12 DIAGNOSIS — E785 Hyperlipidemia, unspecified: Secondary | ICD-10-CM | POA: Diagnosis not present

## 2023-09-12 DIAGNOSIS — M810 Age-related osteoporosis without current pathological fracture: Secondary | ICD-10-CM | POA: Diagnosis not present

## 2023-09-12 DIAGNOSIS — Z1339 Encounter for screening examination for other mental health and behavioral disorders: Secondary | ICD-10-CM | POA: Diagnosis not present

## 2023-09-12 DIAGNOSIS — B359 Dermatophytosis, unspecified: Secondary | ICD-10-CM | POA: Diagnosis not present

## 2023-09-12 DIAGNOSIS — Z1331 Encounter for screening for depression: Secondary | ICD-10-CM | POA: Diagnosis not present

## 2023-09-12 DIAGNOSIS — Z23 Encounter for immunization: Secondary | ICD-10-CM | POA: Diagnosis not present

## 2023-09-12 DIAGNOSIS — I10 Essential (primary) hypertension: Secondary | ICD-10-CM | POA: Diagnosis not present

## 2023-09-12 DIAGNOSIS — R82998 Other abnormal findings in urine: Secondary | ICD-10-CM | POA: Diagnosis not present

## 2023-09-12 DIAGNOSIS — M1990 Unspecified osteoarthritis, unspecified site: Secondary | ICD-10-CM | POA: Diagnosis not present

## 2023-09-12 DIAGNOSIS — M792 Neuralgia and neuritis, unspecified: Secondary | ICD-10-CM | POA: Diagnosis not present

## 2023-10-09 DIAGNOSIS — M810 Age-related osteoporosis without current pathological fracture: Secondary | ICD-10-CM | POA: Diagnosis not present

## 2023-10-23 DIAGNOSIS — H10411 Chronic giant papillary conjunctivitis, right eye: Secondary | ICD-10-CM | POA: Diagnosis not present

## 2023-10-30 DIAGNOSIS — H10411 Chronic giant papillary conjunctivitis, right eye: Secondary | ICD-10-CM | POA: Diagnosis not present

## 2023-10-30 DIAGNOSIS — H02401 Unspecified ptosis of right eyelid: Secondary | ICD-10-CM | POA: Diagnosis not present

## 2023-11-24 DIAGNOSIS — H401232 Low-tension glaucoma, bilateral, moderate stage: Secondary | ICD-10-CM | POA: Diagnosis not present

## 2023-11-27 DIAGNOSIS — H02413 Mechanical ptosis of bilateral eyelids: Secondary | ICD-10-CM | POA: Diagnosis not present

## 2024-02-26 DIAGNOSIS — H401131 Primary open-angle glaucoma, bilateral, mild stage: Secondary | ICD-10-CM | POA: Diagnosis not present

## 2024-02-26 DIAGNOSIS — Z961 Presence of intraocular lens: Secondary | ICD-10-CM | POA: Diagnosis not present

## 2024-03-24 DIAGNOSIS — Z23 Encounter for immunization: Secondary | ICD-10-CM | POA: Diagnosis not present

## 2024-05-27 DIAGNOSIS — H401132 Primary open-angle glaucoma, bilateral, moderate stage: Secondary | ICD-10-CM | POA: Diagnosis not present

## 2024-05-27 DIAGNOSIS — H04123 Dry eye syndrome of bilateral lacrimal glands: Secondary | ICD-10-CM | POA: Diagnosis not present

## 2024-05-31 ENCOUNTER — Other Ambulatory Visit: Payer: Self-pay | Admitting: Internal Medicine

## 2024-05-31 DIAGNOSIS — Z1231 Encounter for screening mammogram for malignant neoplasm of breast: Secondary | ICD-10-CM

## 2024-07-14 ENCOUNTER — Ambulatory Visit

## 2024-08-11 ENCOUNTER — Ambulatory Visit
